# Patient Record
Sex: Male | Born: 1940 | Race: White | Hispanic: No | Marital: Married | Smoking: Former smoker
Health system: Southern US, Community
[De-identification: ages and names within clinical notes are randomized; demographics above are authoritative.]

## PROBLEM LIST (undated history)

## (undated) DIAGNOSIS — J189 Pneumonia, unspecified organism: Secondary | ICD-10-CM

## (undated) DIAGNOSIS — C801 Malignant (primary) neoplasm, unspecified: Secondary | ICD-10-CM

## (undated) DIAGNOSIS — E785 Hyperlipidemia, unspecified: Secondary | ICD-10-CM

## (undated) DIAGNOSIS — I10 Essential (primary) hypertension: Secondary | ICD-10-CM

## (undated) DIAGNOSIS — K219 Gastro-esophageal reflux disease without esophagitis: Secondary | ICD-10-CM

## (undated) HISTORY — PX: OTHER SURGICAL HISTORY: SHX169

## (undated) HISTORY — PX: TONSILLECTOMY: SUR1361

## (undated) HISTORY — PX: COLONOSCOPY: SHX174

## (undated) SURGERY — Surgical Case
Anesthesia: *Unknown

---

## 2005-11-19 ENCOUNTER — Ambulatory Visit: Payer: Self-pay | Admitting: Gastroenterology

## 2006-09-02 ENCOUNTER — Ambulatory Visit: Payer: Self-pay | Admitting: Unknown Physician Specialty

## 2006-09-08 ENCOUNTER — Ambulatory Visit: Payer: Self-pay | Admitting: Unknown Physician Specialty

## 2006-09-08 ENCOUNTER — Other Ambulatory Visit: Payer: Self-pay

## 2007-09-10 ENCOUNTER — Ambulatory Visit: Payer: Self-pay | Admitting: Internal Medicine

## 2008-10-10 ENCOUNTER — Ambulatory Visit: Payer: Self-pay | Admitting: Orthopedic Surgery

## 2010-10-29 ENCOUNTER — Ambulatory Visit: Payer: Self-pay | Admitting: Internal Medicine

## 2014-09-06 DIAGNOSIS — H5210 Myopia, unspecified eye: Secondary | ICD-10-CM | POA: Diagnosis not present

## 2014-09-06 DIAGNOSIS — H524 Presbyopia: Secondary | ICD-10-CM | POA: Diagnosis not present

## 2015-01-02 DIAGNOSIS — Z0000 Encounter for general adult medical examination without abnormal findings: Secondary | ICD-10-CM | POA: Diagnosis not present

## 2015-01-16 DIAGNOSIS — I10 Essential (primary) hypertension: Secondary | ICD-10-CM | POA: Diagnosis not present

## 2015-01-16 DIAGNOSIS — E782 Mixed hyperlipidemia: Secondary | ICD-10-CM | POA: Diagnosis not present

## 2015-01-16 DIAGNOSIS — Z0000 Encounter for general adult medical examination without abnormal findings: Secondary | ICD-10-CM | POA: Diagnosis not present

## 2015-07-04 DIAGNOSIS — J40 Bronchitis, not specified as acute or chronic: Secondary | ICD-10-CM | POA: Diagnosis not present

## 2015-08-16 DIAGNOSIS — L0211 Cutaneous abscess of neck: Secondary | ICD-10-CM | POA: Diagnosis not present

## 2015-08-24 DIAGNOSIS — H5210 Myopia, unspecified eye: Secondary | ICD-10-CM | POA: Diagnosis not present

## 2015-08-24 DIAGNOSIS — H524 Presbyopia: Secondary | ICD-10-CM | POA: Diagnosis not present

## 2015-10-09 DIAGNOSIS — Z0100 Encounter for examination of eyes and vision without abnormal findings: Secondary | ICD-10-CM | POA: Diagnosis not present

## 2015-11-14 DIAGNOSIS — R05 Cough: Secondary | ICD-10-CM | POA: Diagnosis not present

## 2015-11-14 DIAGNOSIS — B351 Tinea unguium: Secondary | ICD-10-CM | POA: Diagnosis not present

## 2015-11-14 DIAGNOSIS — K219 Gastro-esophageal reflux disease without esophagitis: Secondary | ICD-10-CM | POA: Diagnosis not present

## 2015-11-14 DIAGNOSIS — R0609 Other forms of dyspnea: Secondary | ICD-10-CM | POA: Diagnosis not present

## 2016-01-18 DIAGNOSIS — I10 Essential (primary) hypertension: Secondary | ICD-10-CM | POA: Diagnosis not present

## 2016-01-18 DIAGNOSIS — Z125 Encounter for screening for malignant neoplasm of prostate: Secondary | ICD-10-CM | POA: Diagnosis not present

## 2016-01-29 DIAGNOSIS — Z0000 Encounter for general adult medical examination without abnormal findings: Secondary | ICD-10-CM | POA: Diagnosis not present

## 2016-03-29 DIAGNOSIS — Z1211 Encounter for screening for malignant neoplasm of colon: Secondary | ICD-10-CM | POA: Diagnosis not present

## 2016-03-29 DIAGNOSIS — K219 Gastro-esophageal reflux disease without esophagitis: Secondary | ICD-10-CM | POA: Diagnosis not present

## 2016-04-16 ENCOUNTER — Other Ambulatory Visit: Payer: Self-pay | Admitting: Internal Medicine

## 2016-04-16 DIAGNOSIS — I6523 Occlusion and stenosis of bilateral carotid arteries: Secondary | ICD-10-CM

## 2016-04-24 ENCOUNTER — Ambulatory Visit
Admission: RE | Admit: 2016-04-24 | Discharge: 2016-04-24 | Disposition: A | Discharge status: Home / Self Care | Payer: Commercial Managed Care - HMO | Source: Ambulatory Visit | Attending: Internal Medicine | Admitting: Internal Medicine

## 2016-04-24 DIAGNOSIS — I6523 Occlusion and stenosis of bilateral carotid arteries: Secondary | ICD-10-CM | POA: Insufficient documentation

## 2016-05-29 ENCOUNTER — Other Ambulatory Visit: Payer: Self-pay | Admitting: Otolaryngology

## 2016-05-29 DIAGNOSIS — H93A2 Pulsatile tinnitus, left ear: Secondary | ICD-10-CM | POA: Diagnosis not present

## 2016-05-29 DIAGNOSIS — H6063 Unspecified chronic otitis externa, bilateral: Secondary | ICD-10-CM | POA: Diagnosis not present

## 2016-06-10 ENCOUNTER — Ambulatory Visit
Admission: RE | Admit: 2016-06-10 | Discharge: 2016-06-10 | Disposition: A | Discharge status: Home / Self Care | Payer: Commercial Managed Care - HMO | Source: Ambulatory Visit | Attending: Otolaryngology | Admitting: Otolaryngology

## 2016-06-10 ENCOUNTER — Ambulatory Visit: Payer: Commercial Managed Care - HMO

## 2016-06-10 DIAGNOSIS — H93A2 Pulsatile tinnitus, left ear: Secondary | ICD-10-CM

## 2016-06-12 DIAGNOSIS — H93A2 Pulsatile tinnitus, left ear: Secondary | ICD-10-CM | POA: Diagnosis not present

## 2016-07-16 DIAGNOSIS — I10 Essential (primary) hypertension: Secondary | ICD-10-CM | POA: Diagnosis not present

## 2016-07-26 ENCOUNTER — Encounter: Payer: Self-pay | Admitting: *Deleted

## 2016-07-29 ENCOUNTER — Encounter
Admission: RE | Disposition: A | Discharge status: Home / Self Care | Payer: Self-pay | Source: Ambulatory Visit | Attending: Gastroenterology

## 2016-07-29 ENCOUNTER — Ambulatory Visit: Payer: Commercial Managed Care - HMO | Admitting: Anesthesiology

## 2016-07-29 ENCOUNTER — Ambulatory Visit
Admission: RE | Admit: 2016-07-29 | Discharge: 2016-07-29 | Disposition: A | Discharge status: Home / Self Care | Payer: Commercial Managed Care - HMO | Source: Ambulatory Visit | Attending: Gastroenterology | Admitting: Gastroenterology

## 2016-07-29 DIAGNOSIS — Z1211 Encounter for screening for malignant neoplasm of colon: Secondary | ICD-10-CM | POA: Insufficient documentation

## 2016-07-29 DIAGNOSIS — K648 Other hemorrhoids: Secondary | ICD-10-CM | POA: Diagnosis not present

## 2016-07-29 DIAGNOSIS — Z79899 Other long term (current) drug therapy: Secondary | ICD-10-CM | POA: Diagnosis not present

## 2016-07-29 DIAGNOSIS — D127 Benign neoplasm of rectosigmoid junction: Secondary | ICD-10-CM | POA: Diagnosis not present

## 2016-07-29 DIAGNOSIS — K635 Polyp of colon: Secondary | ICD-10-CM | POA: Diagnosis not present

## 2016-07-29 DIAGNOSIS — K5730 Diverticulosis of large intestine without perforation or abscess without bleeding: Secondary | ICD-10-CM | POA: Insufficient documentation

## 2016-07-29 DIAGNOSIS — K641 Second degree hemorrhoids: Secondary | ICD-10-CM | POA: Insufficient documentation

## 2016-07-29 DIAGNOSIS — K5790 Diverticulosis of intestine, part unspecified, without perforation or abscess without bleeding: Secondary | ICD-10-CM | POA: Diagnosis not present

## 2016-07-29 DIAGNOSIS — Z7982 Long term (current) use of aspirin: Secondary | ICD-10-CM | POA: Insufficient documentation

## 2016-07-29 DIAGNOSIS — K219 Gastro-esophageal reflux disease without esophagitis: Secondary | ICD-10-CM | POA: Insufficient documentation

## 2016-07-29 DIAGNOSIS — I10 Essential (primary) hypertension: Secondary | ICD-10-CM | POA: Diagnosis not present

## 2016-07-29 HISTORY — DX: Gastro-esophageal reflux disease without esophagitis: K21.9

## 2016-07-29 HISTORY — DX: Essential (primary) hypertension: I10

## 2016-07-29 HISTORY — PX: COLONOSCOPY WITH PROPOFOL: SHX5780

## 2016-07-29 HISTORY — DX: Hyperlipidemia, unspecified: E78.5

## 2016-07-29 SURGERY — COLONOSCOPY WITH PROPOFOL
Anesthesia: General

## 2016-07-29 MED ORDER — SODIUM CHLORIDE 0.9 % IV SOLN: INTRAVENOUS | Status: DC

## 2016-07-29 MED ORDER — SODIUM CHLORIDE 0.9 % IV SOLN
INTRAVENOUS | Status: DC
  Administered 2016-07-29 (×2): via INTRAVENOUS

## 2016-07-29 MED ORDER — PROPOFOL 500 MG/50ML IV EMUL
INTRAVENOUS | Status: DC | PRN
  Administered 2016-07-29: 150 ug/kg/min via INTRAVENOUS

## 2016-07-29 MED ORDER — PROPOFOL 500 MG/50ML IV EMUL
INTRAVENOUS | Status: AC
  Filled 2016-07-29: qty 50

## 2016-07-29 MED ORDER — PROPOFOL 10 MG/ML IV BOLUS
INTRAVENOUS | Status: DC | PRN
  Administered 2016-07-29: 80 mg via INTRAVENOUS

## 2016-07-29 NOTE — Anesthesia Postprocedure Evaluation (Signed)
Anesthesia Post Note  Patient: Patrick Hood  Procedure(s) Performed: Procedure(s) (LRB): COLONOSCOPY WITH PROPOFOL (N/A)  Patient location during evaluation: Endoscopy Anesthesia Type: General Level of consciousness: awake and alert and oriented Pain management: pain level controlled Vital Signs Assessment: post-procedure vital signs reviewed and stable Respiratory status: spontaneous breathing, nonlabored ventilation and respiratory function stable Cardiovascular status: blood pressure returned to baseline and stable Postop Assessment: no signs of nausea or vomiting Anesthetic complications: no     Last Vitals:  Vitals:   07/29/16 1040 07/29/16 1050  BP: 111/79 117/83  Pulse: 66 66  Resp: 17 13  Temp:      Last Pain:  Vitals:   07/29/16 1030  TempSrc: Oral                 [name]

## 2016-07-29 NOTE — H&P (Signed)
Outpatient short stay form Pre-procedure 07/29/2016 9:55 AM Patrick Sails MD  Primary Physician: Dr. Emily Filbert  Reason for visit:  Colonoscopy  History of present illness:  Patient is a 76 year old male presenting today as above. He tolerated his prep well. He does take a mini dose/81 mg aspirin but is held that for several days. He takes no other aspirin or blood thinning agents. He does have some occasional problems with hemorrhoids. He is due for a routine 10 year screening procedure. There is no family history of colon polyps or colon cancer.    Current Facility-Administered Medications:  .  0.9 %  sodium chloride infusion, , Intravenous, Continuous, Patrick Sails, MD, Last Rate: 20 mL/hr at 07/29/16 0853 .  0.9 %  sodium chloride infusion, , Intravenous, Continuous, Patrick Sails, MD  Prescriptions Prior to Admission  Medication Sig Dispense Refill Last Dose  . aspirin 81 MG chewable tablet Chew by mouth daily.   07/27/2016 at Unknown time  . bisoprolol-hydrochlorothiazide (ZIAC) 5-6.25 MG tablet Take 1 tablet by mouth daily.   07/29/2016 at Unknown time  . doxazosin (CARDURA) 4 MG tablet Take 4 mg by mouth daily.   07/28/2016 at Unknown time  . pantoprazole (PROTONIX) 40 MG tablet Take 40 mg by mouth daily.   07/27/2016 at Unknown time  . simvastatin (ZOCOR) 40 MG tablet Take 40 mg by mouth daily.   07/27/2016 at Unknown time     Allergies  Allergen Reactions  . Ace Inhibitors Cough  . Amlodipine Cough     Past Medical History:  Diagnosis Date  . Elevated lipids   . GERD (gastroesophageal reflux disease)   . Hypertension     Review of systems:      Physical Exam    Heart and lungs: Regular rate and rhythm without rub or gallop, lungs are bilaterally clear.    HEENT: Normocephalic atraumatic eyes are anicteric    Other:     Pertinant exam for procedure: Soft nontender nondistended bowel sounds positive normoactive.    Planned proceedures: Colonoscopy and  indicated procedures. I have discussed the risks benefits and complications of procedures to include not limited to bleeding, infection, perforation and the risk of sedation and the patient wishes to proceed.    Patrick Sails, MD Gastroenterology 07/29/2016  9:55 AM

## 2016-07-29 NOTE — Anesthesia Preprocedure Evaluation (Signed)
Anesthesia Evaluation  Patient identified by MRN, date of birth, ID band Patient awake    Reviewed: Allergy & Precautions, NPO status , Patient's Chart, lab work & pertinent test results  History of Anesthesia Complications Negative for: history of anesthetic complications  Airway Mallampati: II  TM Distance: >3 FB Neck ROM: Full    Dental no notable dental hx.    Pulmonary neg pulmonary ROS, neg sleep apnea, neg COPD,    breath sounds clear to auscultation- rhonchi (-) wheezing      Cardiovascular hypertension, Pt. on medications (-) CAD and (-) Past MI  Rhythm:Regular Rate:Normal - Systolic murmurs and - Diastolic murmurs    Neuro/Psych negative neurological ROS  negative psych ROS   GI/Hepatic Neg liver ROS, GERD  ,  Endo/Other  negative endocrine ROSneg diabetes  Renal/GU negative Renal ROS     Musculoskeletal negative musculoskeletal ROS (+)   Abdominal (+) + obese,   Peds  Hematology negative hematology ROS (+)   Anesthesia Other Findings Past Medical History: No date: Elevated lipids No date: GERD (gastroesophageal reflux disease) No date: Hypertension   Reproductive/Obstetrics                             Anesthesia Physical Anesthesia Plan  ASA: II  Anesthesia Plan: General   Post-op Pain Management:    Induction: Intravenous  Airway Management Planned: Natural Airway  Additional Equipment:   Intra-op Plan:   Post-operative Plan:   Informed Consent: I have reviewed the patients History and Physical, chart, labs and discussed the procedure including the risks, benefits and alternatives for the proposed anesthesia with the patient or authorized representative who has indicated his/her understanding and acceptance.   Dental advisory given  Plan Discussed with: CRNA and Anesthesiologist  Anesthesia Plan Comments:         Anesthesia Quick Evaluation

## 2016-07-29 NOTE — Anesthesia Procedure Notes (Signed)
Date/Time: 07/29/2016 10:11 AM Performed by: Nelda Marseille Pre-anesthesia Checklist: Patient identified, Emergency Drugs available, Suction available, Patient being monitored and Timeout performed Oxygen Delivery Method: Nasal cannula

## 2016-07-29 NOTE — Transfer of Care (Signed)
Immediate Anesthesia Transfer of Care Note  Patient: Patrick Hood  Procedure(s) Performed: Procedure(s): COLONOSCOPY WITH PROPOFOL (N/A)  Patient Location: PACU  Anesthesia Type:General  Level of Consciousness: awake, alert  and sedated  Airway & Oxygen Therapy: Patient Spontanous Breathing and Patient connected to nasal cannula oxygen  Post-op Assessment: Report given to RN and Post -op Vital signs reviewed and stable  Post vital signs: Reviewed and stable  Last Vitals:  Vitals:   07/29/16 0840  BP: 139/83  Pulse: 78  Resp: 20  Temp: 36.4 C    Last Pain:  Vitals:   07/29/16 0840  TempSrc: Tympanic         Complications: No apparent anesthesia complications

## 2016-07-29 NOTE — Op Note (Signed)
[facility] Gastroenterology Patient Name: Patrick Hood Procedure Date: 07/29/2016 9:49 AM MRN: [id number] Account #: [id number] Date of Birth: [birth year] Admit Type: Outpatient Age: 76 Room: [facility] ENDO ROOM 3 Gender: Male Note Status: Finalized Procedure:            Colonoscopy Indications:          Screening for colorectal malignant neoplasm Providers:            Lollie Sails, MD Medicines:            Monitored Anesthesia Care Complications:        No immediate complications. Procedure:            Pre-Anesthesia Assessment:                       - ASA Grade Assessment: II - A patient with mild                        systemic disease.                       After obtaining informed consent, the colonoscope was                        passed under direct vision. Throughout the procedure,                        the patient's blood pressure, pulse, and oxygen                        saturations were monitored continuously. The                        Colonoscope was introduced through the anus and                        advanced to the the cecum, identified by appendiceal                        orifice and ileocecal valve. The colonoscopy was                        performed without difficulty. The patient tolerated the                        procedure well. The quality of the bowel preparation                        was good. Findings:      A less than 1 mm polyp was found in the recto-sigmoid colon. The polyp       was sessile. The polyp was removed with a cold biopsy forceps. Resection       and retrieval were complete.      Multiple small-mouthed diverticula were found in the sigmoid colon,       descending colon and distal descending colon.      Non-bleeding internal hemorrhoids were found during retroflexion, during       digital exam and during anoscopy. The hemorrhoids were small,       medium-sized and Grade II (internal hemorrhoids that prolapse but reduce        spontaneously).  The digital rectal exam was normal otherwise.      The exam was otherwise without abnormality. Impression:           - One less than 1 mm polyp at the recto-sigmoid colon,                        removed with a cold biopsy forceps. Resected and                        retrieved.                       - Diverticulosis in the sigmoid colon, in the                        descending colon and in the distal descending colon.                       - Non-bleeding internal hemorrhoids. Recommendation:       - Discharge patient to home.                       - Consider fles sig if continued symptoms. Procedure Code(s):    --- Professional ---                       [phone], Colonoscopy, flexible; with biopsy, single or                        multiple Diagnosis Code(s):    --- Professional ---                       Z12.11, Encounter for screening for malignant neoplasm                        of colon                       D12.7, Benign neoplasm of rectosigmoid junction                       K64.1, Second degree hemorrhoids                       K57.30, Diverticulosis of large intestine without                        perforation or abscess without bleeding CPT copyright 2016 American Medical [employer]. All rights reserved. The codes documented in this report are preliminary and upon coder review may  be revised to meet current compliance requirements. Lollie Sails, MD 07/29/2016 10:30:21 AM This report has been signed electronically. Number of Addenda: 0 Note Initiated On: 07/29/2016 9:49 AM Scope Withdrawal Time: 0 hours 9 minutes 57 seconds  Total Procedure Duration: 0 hours 17 minutes 28 seconds       [facility]

## 2016-07-30 ENCOUNTER — Encounter: Payer: Self-pay | Admitting: Gastroenterology

## 2016-07-30 LAB — SURGICAL PATHOLOGY

## 2016-08-22 DIAGNOSIS — M659 Synovitis and tenosynovitis, unspecified: Secondary | ICD-10-CM | POA: Diagnosis not present

## 2016-09-04 DIAGNOSIS — M659 Synovitis and tenosynovitis, unspecified: Secondary | ICD-10-CM | POA: Diagnosis not present

## 2016-09-04 DIAGNOSIS — M25611 Stiffness of right shoulder, not elsewhere classified: Secondary | ICD-10-CM | POA: Diagnosis not present

## 2016-09-04 DIAGNOSIS — M6281 Muscle weakness (generalized): Secondary | ICD-10-CM | POA: Diagnosis not present

## 2016-09-04 DIAGNOSIS — M25511 Pain in right shoulder: Secondary | ICD-10-CM | POA: Diagnosis not present

## 2016-09-09 DIAGNOSIS — H524 Presbyopia: Secondary | ICD-10-CM | POA: Diagnosis not present

## 2016-09-11 DIAGNOSIS — M25511 Pain in right shoulder: Secondary | ICD-10-CM | POA: Diagnosis not present

## 2016-09-11 DIAGNOSIS — M659 Synovitis and tenosynovitis, unspecified: Secondary | ICD-10-CM | POA: Diagnosis not present

## 2016-09-11 DIAGNOSIS — M6281 Muscle weakness (generalized): Secondary | ICD-10-CM | POA: Diagnosis not present

## 2016-09-11 DIAGNOSIS — M25611 Stiffness of right shoulder, not elsewhere classified: Secondary | ICD-10-CM | POA: Diagnosis not present

## 2016-09-13 DIAGNOSIS — M659 Synovitis and tenosynovitis, unspecified: Secondary | ICD-10-CM | POA: Diagnosis not present

## 2016-09-13 DIAGNOSIS — M25511 Pain in right shoulder: Secondary | ICD-10-CM | POA: Diagnosis not present

## 2016-09-13 DIAGNOSIS — M6281 Muscle weakness (generalized): Secondary | ICD-10-CM | POA: Diagnosis not present

## 2016-09-16 DIAGNOSIS — M25511 Pain in right shoulder: Secondary | ICD-10-CM | POA: Diagnosis not present

## 2016-09-16 DIAGNOSIS — M659 Synovitis and tenosynovitis, unspecified: Secondary | ICD-10-CM | POA: Diagnosis not present

## 2016-09-16 DIAGNOSIS — M6281 Muscle weakness (generalized): Secondary | ICD-10-CM | POA: Diagnosis not present

## 2016-09-18 DIAGNOSIS — M25511 Pain in right shoulder: Secondary | ICD-10-CM | POA: Diagnosis not present

## 2016-09-18 DIAGNOSIS — M659 Synovitis and tenosynovitis, unspecified: Secondary | ICD-10-CM | POA: Diagnosis not present

## 2016-09-18 DIAGNOSIS — M25611 Stiffness of right shoulder, not elsewhere classified: Secondary | ICD-10-CM | POA: Diagnosis not present

## 2016-09-18 DIAGNOSIS — M6281 Muscle weakness (generalized): Secondary | ICD-10-CM | POA: Diagnosis not present

## 2016-09-23 DIAGNOSIS — M6281 Muscle weakness (generalized): Secondary | ICD-10-CM | POA: Diagnosis not present

## 2016-09-23 DIAGNOSIS — M25511 Pain in right shoulder: Secondary | ICD-10-CM | POA: Diagnosis not present

## 2016-09-23 DIAGNOSIS — M659 Synovitis and tenosynovitis, unspecified: Secondary | ICD-10-CM | POA: Diagnosis not present

## 2016-09-23 DIAGNOSIS — M25611 Stiffness of right shoulder, not elsewhere classified: Secondary | ICD-10-CM | POA: Diagnosis not present

## 2016-09-25 DIAGNOSIS — M659 Synovitis and tenosynovitis, unspecified: Secondary | ICD-10-CM | POA: Diagnosis not present

## 2016-10-11 DIAGNOSIS — G8929 Other chronic pain: Secondary | ICD-10-CM | POA: Diagnosis not present

## 2016-10-11 DIAGNOSIS — M7521 Bicipital tendinitis, right shoulder: Secondary | ICD-10-CM | POA: Diagnosis not present

## 2016-10-11 DIAGNOSIS — M25511 Pain in right shoulder: Secondary | ICD-10-CM | POA: Diagnosis not present

## 2016-10-14 ENCOUNTER — Other Ambulatory Visit: Payer: Self-pay | Admitting: Student

## 2016-10-14 ENCOUNTER — Other Ambulatory Visit: Payer: Self-pay | Admitting: Surgery

## 2016-10-14 DIAGNOSIS — M25511 Pain in right shoulder: Secondary | ICD-10-CM

## 2016-10-14 DIAGNOSIS — G8929 Other chronic pain: Secondary | ICD-10-CM

## 2016-10-14 DIAGNOSIS — M7521 Bicipital tendinitis, right shoulder: Secondary | ICD-10-CM

## 2016-10-25 ENCOUNTER — Ambulatory Visit: Payer: Commercial Managed Care - HMO

## 2016-10-30 ENCOUNTER — Ambulatory Visit: Payer: Commercial Managed Care - HMO

## 2016-10-31 ENCOUNTER — Ambulatory Visit
Admission: RE | Admit: 2016-10-31 | Discharge: 2016-10-31 | Disposition: A | Discharge status: Home / Self Care | Payer: Medicare HMO | Source: Ambulatory Visit | Attending: Surgery | Admitting: Surgery

## 2016-10-31 ENCOUNTER — Ambulatory Visit
Admission: RE | Admit: 2016-10-31 | Discharge: 2016-10-31 | Disposition: A | Discharge status: Home / Self Care | Payer: Medicare HMO | Source: Ambulatory Visit | Attending: Student | Admitting: Student

## 2016-10-31 DIAGNOSIS — G8929 Other chronic pain: Secondary | ICD-10-CM | POA: Diagnosis not present

## 2016-10-31 DIAGNOSIS — M7551 Bursitis of right shoulder: Secondary | ICD-10-CM | POA: Insufficient documentation

## 2016-10-31 DIAGNOSIS — M25511 Pain in right shoulder: Secondary | ICD-10-CM | POA: Insufficient documentation

## 2016-10-31 DIAGNOSIS — M7521 Bicipital tendinitis, right shoulder: Secondary | ICD-10-CM

## 2016-10-31 DIAGNOSIS — M19011 Primary osteoarthritis, right shoulder: Secondary | ICD-10-CM | POA: Insufficient documentation

## 2016-10-31 DIAGNOSIS — M75101 Unspecified rotator cuff tear or rupture of right shoulder, not specified as traumatic: Secondary | ICD-10-CM | POA: Insufficient documentation

## 2016-10-31 MED ORDER — GADOBENATE DIMEGLUMINE 529 MG/ML IV SOLN
5.0000 mL | Freq: Once | INTRAVENOUS | Status: AC | PRN
  Administered 2016-10-31: 0.1 mL via INTRA_ARTICULAR

## 2016-10-31 MED ORDER — SODIUM CHLORIDE 0.9 % IJ SOLN
20.0000 mL | INTRAMUSCULAR | Status: DC | PRN
  Administered 2016-10-31: 10 mL

## 2016-10-31 MED ORDER — IOPAMIDOL (ISOVUE-200) INJECTION 41%
50.0000 mL | Freq: Once | INTRAVENOUS | Status: AC
  Administered 2016-10-31: 15 mL via INTRA_ARTICULAR
  Filled 2016-10-31: qty 50

## 2016-10-31 MED ORDER — LIDOCAINE HCL (PF) 1 % IJ SOLN
10.0000 mL | Freq: Once | INTRAMUSCULAR | Status: AC
  Administered 2016-10-31: 10 mL
  Filled 2016-10-31: qty 10

## 2016-11-04 DIAGNOSIS — M24111 Other articular cartilage disorders, right shoulder: Secondary | ICD-10-CM | POA: Diagnosis not present

## 2016-11-04 DIAGNOSIS — M7581 Other shoulder lesions, right shoulder: Secondary | ICD-10-CM | POA: Diagnosis not present

## 2016-11-04 DIAGNOSIS — M75111 Incomplete rotator cuff tear or rupture of right shoulder, not specified as traumatic: Secondary | ICD-10-CM | POA: Diagnosis not present

## 2016-11-13 ENCOUNTER — Ambulatory Visit: Payer: Commercial Managed Care - HMO

## 2016-11-28 ENCOUNTER — Encounter
Admission: RE | Admit: 2016-11-28 | Discharge: 2016-11-28 | Disposition: A | Discharge status: Home / Self Care | Payer: Medicare HMO | Source: Ambulatory Visit | Attending: Surgery | Admitting: Surgery

## 2016-11-28 DIAGNOSIS — Z01812 Encounter for preprocedural laboratory examination: Secondary | ICD-10-CM | POA: Diagnosis not present

## 2016-11-28 DIAGNOSIS — Z01810 Encounter for preprocedural cardiovascular examination: Secondary | ICD-10-CM | POA: Diagnosis not present

## 2016-11-28 DIAGNOSIS — I10 Essential (primary) hypertension: Secondary | ICD-10-CM | POA: Diagnosis not present

## 2016-11-28 LAB — CBC
HEMATOCRIT: 44.7 % (ref 40.0–52.0)
Hemoglobin: 15.1 g/dL (ref 13.0–18.0)
MCH: 31 pg (ref 26.0–34.0)
MCHC: 33.9 g/dL (ref 32.0–36.0)
MCV: 91.4 fL (ref 80.0–100.0)
Platelets: 266 10*3/uL (ref 150–440)
RBC: 4.89 MIL/uL (ref 4.40–5.90)
RDW: 13.3 % (ref 11.5–14.5)
WBC: 7 10*3/uL (ref 3.8–10.6)

## 2016-11-28 NOTE — Patient Instructions (Signed)
Your procedure is scheduled on: 12/17/16 Tues Report to Same Day Surgery 2nd floor medical mall [facility] Entrance-take elevator on left to 2nd floor.  Check in with surgery information desk.) To find out your arrival time please call [phone] between 1PM - 3PM on 12/16/16 Mon  Remember: Instructions that are not followed completely may result in serious medical risk, up to and including death, or upon the discretion of your surgeon and anesthesiologist your surgery may need to be rescheduled.    _x___ 1. Do not eat food or drink liquids after midnight. No gum chewing or                              hard candies.     __x__ 2. No Alcohol for 24 hours before or after surgery.   __x__3. No Smoking for 24 prior to surgery.   ____  4. Bring all medications with you on the day of surgery if instructed.    __x__ 5. Notify your doctor if there is any change in your medical condition     (cold, fever, infections).     Do not wear jewelry, make-up, hairpins, clips or nail polish.  Do not wear lotions, powders, or perfumes. You may wear deodorant.  Do not shave 48 hours prior to surgery. Men may shave face and neck.  Do not bring valuables to the hospital.    [facility] is not responsible for any belongings or valuables.               Contacts, dentures or bridgework may not be worn into surgery.  Leave your suitcase in the car. After surgery it may be brought to your room.  For patients admitted to the hospital, discharge time is determined by your                       treatment team.   Patients discharged the day of surgery will not be allowed to drive home.  You will need someone to drive you home and stay with you the night of your procedure.    Please read over the following fact sheets that you were given:   [facility] Preparing for Surgery and or MRSA Information   _x___ Take anti-hypertensive (unless it includes a diuretic), cardiac, seizure, asthma,     anti-reflux and  psychiatric medicines. These include:  1. pantoprazole (PROTONIX  2.  3.  4.  5.  6.  ____Fleets enema or Magnesium Citrate as directed.   _x___ Use CHG Soap or sage wipes as directed on instruction sheet   ____ Use inhalers on the day of surgery and bring to hospital day of surgery  ____ Stop Metformin and Janumet 2 days prior to surgery.    ____ Take 1/2 of usual insulin dose the night before surgery and none on the morning     surgery.   _x___ Follow recommendations from Cardiologist, Pulmonologist or PCP regarding          stopping Aspirin, Coumadin, Pllavix ,Eliquis, Effient, or Pradaxa, and Pletal.  X____Stop Anti-inflammatories such as Advil, Aleve, Ibuprofen, Motrin, Naproxen, Naprosyn, Goodies powders or aspirin products. OK to take Tylenol and                          Celebrex.   _x___ Stop supplements until after surgery.  But may continue Vitamin D, Vitamin  B,       and multivitamin.   ____ Bring C-Pap to the hospital.

## 2016-12-10 ENCOUNTER — Other Ambulatory Visit: Payer: Commercial Managed Care - HMO

## 2016-12-16 MED ORDER — CEFAZOLIN SODIUM-DEXTROSE 2-4 GM/100ML-% IV SOLN
2.0000 g | Freq: Once | INTRAVENOUS | Status: AC
  Administered 2016-12-17: 2 g via INTRAVENOUS

## 2016-12-17 ENCOUNTER — Encounter: Payer: Self-pay | Admitting: *Deleted

## 2016-12-17 ENCOUNTER — Ambulatory Visit
Admission: RE | Admit: 2016-12-17 | Discharge: 2016-12-17 | Disposition: A | Discharge status: Home / Self Care | Payer: Medicare HMO | Source: Ambulatory Visit | Attending: Surgery | Admitting: Surgery

## 2016-12-17 ENCOUNTER — Ambulatory Visit: Payer: Medicare HMO | Admitting: Anesthesiology

## 2016-12-17 ENCOUNTER — Encounter
Admission: RE | Disposition: A | Discharge status: Home / Self Care | Payer: Self-pay | Source: Ambulatory Visit | Attending: Surgery

## 2016-12-17 DIAGNOSIS — Z79899 Other long term (current) drug therapy: Secondary | ICD-10-CM | POA: Diagnosis not present

## 2016-12-17 DIAGNOSIS — Z888 Allergy status to other drugs, medicaments and biological substances status: Secondary | ICD-10-CM | POA: Diagnosis not present

## 2016-12-17 DIAGNOSIS — S43401A Unspecified sprain of right shoulder joint, initial encounter: Secondary | ICD-10-CM | POA: Diagnosis not present

## 2016-12-17 DIAGNOSIS — Z7982 Long term (current) use of aspirin: Secondary | ICD-10-CM | POA: Diagnosis not present

## 2016-12-17 DIAGNOSIS — G8918 Other acute postprocedural pain: Secondary | ICD-10-CM | POA: Diagnosis not present

## 2016-12-17 DIAGNOSIS — M7521 Bicipital tendinitis, right shoulder: Secondary | ICD-10-CM | POA: Diagnosis not present

## 2016-12-17 DIAGNOSIS — J449 Chronic obstructive pulmonary disease, unspecified: Secondary | ICD-10-CM | POA: Insufficient documentation

## 2016-12-17 DIAGNOSIS — M24111 Other articular cartilage disorders, right shoulder: Secondary | ICD-10-CM | POA: Diagnosis not present

## 2016-12-17 DIAGNOSIS — Z87891 Personal history of nicotine dependence: Secondary | ICD-10-CM | POA: Insufficient documentation

## 2016-12-17 DIAGNOSIS — M7531 Calcific tendinitis of right shoulder: Secondary | ICD-10-CM | POA: Insufficient documentation

## 2016-12-17 DIAGNOSIS — M7581 Other shoulder lesions, right shoulder: Secondary | ICD-10-CM | POA: Diagnosis not present

## 2016-12-17 DIAGNOSIS — M7541 Impingement syndrome of right shoulder: Secondary | ICD-10-CM | POA: Diagnosis not present

## 2016-12-17 DIAGNOSIS — M65811 Other synovitis and tenosynovitis, right shoulder: Secondary | ICD-10-CM | POA: Insufficient documentation

## 2016-12-17 DIAGNOSIS — E785 Hyperlipidemia, unspecified: Secondary | ICD-10-CM | POA: Insufficient documentation

## 2016-12-17 DIAGNOSIS — M7591 Shoulder lesion, unspecified, right shoulder: Secondary | ICD-10-CM | POA: Diagnosis not present

## 2016-12-17 DIAGNOSIS — K219 Gastro-esophageal reflux disease without esophagitis: Secondary | ICD-10-CM | POA: Insufficient documentation

## 2016-12-17 DIAGNOSIS — M75111 Incomplete rotator cuff tear or rupture of right shoulder, not specified as traumatic: Secondary | ICD-10-CM | POA: Insufficient documentation

## 2016-12-17 DIAGNOSIS — I10 Essential (primary) hypertension: Secondary | ICD-10-CM | POA: Diagnosis not present

## 2016-12-17 DIAGNOSIS — M25511 Pain in right shoulder: Secondary | ICD-10-CM | POA: Diagnosis not present

## 2016-12-17 DIAGNOSIS — Z5333 Arthroscopic surgical procedure converted to open procedure: Secondary | ICD-10-CM | POA: Diagnosis not present

## 2016-12-17 HISTORY — PX: BICEPT TENODESIS: SHX5116

## 2016-12-17 HISTORY — PX: SHOULDER ARTHROSCOPY WITH OPEN ROTATOR CUFF REPAIR: SHX6092

## 2016-12-17 HISTORY — PX: SUBACROMIAL DECOMPRESSION: SHX5174

## 2016-12-17 SURGERY — ARTHROSCOPY, SHOULDER WITH REPAIR, ROTATOR CUFF, OPEN
Anesthesia: General | Site: Shoulder | Laterality: Right | Wound class: Clean

## 2016-12-17 MED ORDER — ONDANSETRON HCL 4 MG/2ML IJ SOLN
INTRAMUSCULAR | Status: DC | PRN
  Administered 2016-12-17: 4 mg via INTRAVENOUS

## 2016-12-17 MED ORDER — LIDOCAINE HCL (PF) 2 % IJ SOLN
INTRAMUSCULAR | Status: AC
  Filled 2016-12-17: qty 2

## 2016-12-17 MED ORDER — FENTANYL CITRATE (PF) 100 MCG/2ML IJ SOLN: 25.0000 ug | INTRAMUSCULAR | Status: DC | PRN

## 2016-12-17 MED ORDER — FENTANYL CITRATE (PF) 100 MCG/2ML IJ SOLN
INTRAMUSCULAR | Status: AC
  Administered 2016-12-17: 50 ug via INTRAVENOUS
  Filled 2016-12-17: qty 2

## 2016-12-17 MED ORDER — EPINEPHRINE 30 MG/30ML IJ SOLN
INTRAMUSCULAR | Status: AC
  Filled 2016-12-17: qty 1

## 2016-12-17 MED ORDER — MIDAZOLAM HCL 2 MG/2ML IJ SOLN
INTRAMUSCULAR | Status: AC
  Administered 2016-12-17: 1 mg via INTRAVENOUS
  Filled 2016-12-17: qty 2

## 2016-12-17 MED ORDER — ROPIVACAINE HCL 2 MG/ML IJ SOLN
INTRAMUSCULAR | Status: AC
  Filled 2016-12-17: qty 40

## 2016-12-17 MED ORDER — FENTANYL CITRATE (PF) 100 MCG/2ML IJ SOLN
50.0000 ug | Freq: Once | INTRAMUSCULAR | Status: AC
  Administered 2016-12-17: 50 ug via INTRAVENOUS

## 2016-12-17 MED ORDER — LIDOCAINE HCL (CARDIAC) 20 MG/ML IV SOLN
INTRAVENOUS | Status: DC | PRN
  Administered 2016-12-17: 100 mg via INTRAVENOUS

## 2016-12-17 MED ORDER — MIDAZOLAM HCL 2 MG/2ML IJ SOLN
1.0000 mg | Freq: Once | INTRAMUSCULAR | Status: AC
  Administered 2016-12-17: 1 mg via INTRAVENOUS

## 2016-12-17 MED ORDER — BUPIVACAINE-EPINEPHRINE (PF) 0.5% -1:200000 IJ SOLN
INTRAMUSCULAR | Status: AC
  Filled 2016-12-17: qty 30

## 2016-12-17 MED ORDER — FENTANYL CITRATE (PF) 100 MCG/2ML IJ SOLN
INTRAMUSCULAR | Status: DC | PRN
  Administered 2016-12-17: 25 ug via INTRAVENOUS
  Administered 2016-12-17: 75 ug via INTRAVENOUS
  Administered 2016-12-17: 100 ug via INTRAVENOUS

## 2016-12-17 MED ORDER — ROCURONIUM BROMIDE 50 MG/5ML IV SOLN
INTRAVENOUS | Status: AC
  Filled 2016-12-17: qty 1

## 2016-12-17 MED ORDER — FENTANYL CITRATE (PF) 100 MCG/2ML IJ SOLN
INTRAMUSCULAR | Status: AC
  Filled 2016-12-17: qty 2

## 2016-12-17 MED ORDER — OXYCODONE HCL 5 MG/5ML PO SOLN: 5.0000 mg | Freq: Once | ORAL | Status: AC | PRN

## 2016-12-17 MED ORDER — PROPOFOL 10 MG/ML IV BOLUS
INTRAVENOUS | Status: DC | PRN
  Administered 2016-12-17: 140 mg via INTRAVENOUS

## 2016-12-17 MED ORDER — OXYCODONE HCL 5 MG PO TABS
ORAL_TABLET | ORAL | Status: AC
  Filled 2016-12-17: qty 1

## 2016-12-17 MED ORDER — LIDOCAINE HCL (PF) 1 % IJ SOLN
INTRAMUSCULAR | Status: DC | PRN
  Administered 2016-12-17: 1 mL via INTRADERMAL

## 2016-12-17 MED ORDER — LACTATED RINGERS IV SOLN
INTRAVENOUS | Status: DC
  Administered 2016-12-17 (×2): via INTRAVENOUS

## 2016-12-17 MED ORDER — SUGAMMADEX SODIUM 500 MG/5ML IV SOLN
INTRAVENOUS | Status: AC
  Filled 2016-12-17: qty 5

## 2016-12-17 MED ORDER — LIDOCAINE HCL (PF) 1 % IJ SOLN
INTRAMUSCULAR | Status: AC
  Filled 2016-12-17: qty 5

## 2016-12-17 MED ORDER — PHENYLEPHRINE HCL 10 MG/ML IJ SOLN
INTRAVENOUS | Status: DC | PRN
  Administered 2016-12-17: 30 ug/min via INTRAVENOUS

## 2016-12-17 MED ORDER — SUGAMMADEX SODIUM 200 MG/2ML IV SOLN
INTRAVENOUS | Status: DC | PRN
  Administered 2016-12-17: 140 mg via INTRAVENOUS

## 2016-12-17 MED ORDER — CEFAZOLIN SODIUM-DEXTROSE 2-4 GM/100ML-% IV SOLN
INTRAVENOUS | Status: AC
  Filled 2016-12-17: qty 100

## 2016-12-17 MED ORDER — OXYCODONE HCL 5 MG PO TABS: 5.0000 mg | ORAL_TABLET | ORAL | 0 refills | Status: DC | PRN

## 2016-12-17 MED ORDER — ROPIVACAINE HCL 5 MG/ML IJ SOLN
INTRAMUSCULAR | Status: DC | PRN
  Administered 2016-12-17: 20 mL via PERINEURAL
  Administered 2016-12-17: 10 mL via PERINEURAL

## 2016-12-17 MED ORDER — BUPIVACAINE-EPINEPHRINE (PF) 0.5% -1:200000 IJ SOLN
INTRAMUSCULAR | Status: DC | PRN
  Administered 2016-12-17: 30 mL via PERINEURAL

## 2016-12-17 MED ORDER — LACTATED RINGERS IV SOLN
INTRAVENOUS | Status: DC | PRN
  Administered 2016-12-17: 2 mL

## 2016-12-17 MED ORDER — LIDOCAINE HCL (PF) 4 % IJ SOLN
INTRAMUSCULAR | Status: DC | PRN
  Administered 2016-12-17: 4 mL via RESPIRATORY_TRACT

## 2016-12-17 MED ORDER — OXYCODONE HCL 5 MG PO TABS
5.0000 mg | ORAL_TABLET | Freq: Once | ORAL | Status: AC | PRN
  Administered 2016-12-17: 5 mg via ORAL

## 2016-12-17 MED ORDER — ROCURONIUM BROMIDE 100 MG/10ML IV SOLN
INTRAVENOUS | Status: DC | PRN
  Administered 2016-12-17: 50 mg via INTRAVENOUS
  Administered 2016-12-17: 20 mg via INTRAVENOUS

## 2016-12-17 MED ORDER — EPINEPHRINE PF 1 MG/ML IJ SOLN
INTRAMUSCULAR | Status: AC
  Filled 2016-12-17: qty 2

## 2016-12-17 MED ORDER — ONDANSETRON HCL 4 MG/2ML IJ SOLN
INTRAMUSCULAR | Status: AC
  Filled 2016-12-17: qty 2

## 2016-12-17 SURGICAL SUPPLY — 47 items
ANCHOR JUGGERKNOT WTAP NDL 2.9 (Anchor) ×8 IMPLANT
BIT DRILL JUGRKNT W/NDL BIT2.9 (DRILL) ×2 IMPLANT
BLADE FULL RADIUS 3.5 (BLADE) ×4 IMPLANT
BUR ACROMIONIZER 4.0 (BURR) ×4 IMPLANT
CANNULA SHAVER 8MMX76MM (CANNULA) ×4 IMPLANT
CHLORAPREP W/TINT 26ML (MISCELLANEOUS) ×4 IMPLANT
COVER MAYO STAND STRL (DRAPES) ×4 IMPLANT
DRAPE IMP U-DRAPE 54X76 (DRAPES) ×8 IMPLANT
DRILL JUGGERKNOT W/NDL BIT 2.9 (DRILL) ×4
DRSG OPSITE POSTOP 4X8 (GAUZE/BANDAGES/DRESSINGS) ×4 IMPLANT
ELECT REM PT RETURN 9FT ADLT (ELECTROSURGICAL) ×4
ELECTRODE REM PT RTRN 9FT ADLT (ELECTROSURGICAL) ×2 IMPLANT
GAUZE PETRO XEROFOAM 1X8 (MISCELLANEOUS) ×4 IMPLANT
GAUZE SPONGE 4X4 12PLY STRL (GAUZE/BANDAGES/DRESSINGS) ×4 IMPLANT
GLOVE BIO SURGEON STRL SZ7.5 (GLOVE) ×12 IMPLANT
GLOVE BIO SURGEON STRL SZ8 (GLOVE) ×8 IMPLANT
GLOVE BIOGEL PI IND STRL 8 (GLOVE) ×6 IMPLANT
GLOVE BIOGEL PI INDICATOR 8 (GLOVE) ×6
GLOVE INDICATOR 8.0 STRL GRN (GLOVE) ×4 IMPLANT
GOWN STRL REUS W/ TWL LRG LVL3 (GOWN DISPOSABLE) ×6 IMPLANT
GOWN STRL REUS W/ TWL XL LVL3 (GOWN DISPOSABLE) ×2 IMPLANT
GOWN STRL REUS W/TWL LRG LVL3 (GOWN DISPOSABLE) ×6
GOWN STRL REUS W/TWL XL LVL3 (GOWN DISPOSABLE) ×2
GRASPER SUT 15 45D LOW PRO (SUTURE) IMPLANT
IV LACTATED RINGER IRRG 3000ML (IV SOLUTION) ×4
IV LR IRRIG 3000ML ARTHROMATIC (IV SOLUTION) ×4 IMPLANT
MANIFOLD NEPTUNE II (INSTRUMENTS) ×4 IMPLANT
MASK FACE SPIDER DISP (MASK) ×4 IMPLANT
MAT BLUE FLOOR 46X72 FLO (MISCELLANEOUS) ×4 IMPLANT
NDL MAYO CATGUT SZ5 (NEEDLE)
NDL SUT 5 .5 CRC TPR PNT MAYO (NEEDLE) IMPLANT
NEEDLE REVERSE CUT 1/2 CRC (NEEDLE) IMPLANT
PACK ARTHROSCOPY SHOULDER (MISCELLANEOUS) ×4 IMPLANT
SLEEVE PROTECTION STRL DISP (MISCELLANEOUS) ×4 IMPLANT
SLING ARM LRG DEEP (SOFTGOODS) ×4 IMPLANT
SLING ULTRA II LG (MISCELLANEOUS) ×4 IMPLANT
STAPLER SKIN PROX 35W (STAPLE) ×4 IMPLANT
STRAP SAFETY BODY (MISCELLANEOUS) ×4 IMPLANT
SUT ETHIBOND 0 MO6 C/R (SUTURE) ×4 IMPLANT
SUT VIC AB 2-0 CT1 27 (SUTURE) ×4
SUT VIC AB 2-0 CT1 TAPERPNT 27 (SUTURE) ×4 IMPLANT
SYR 10ML LL (SYRINGE) ×4 IMPLANT
TAPE MICROFOAM 4IN (TAPE) ×4 IMPLANT
TUBING ARTHRO INFLOW-ONLY STRL (TUBING) ×4 IMPLANT
TUBING CONNECTING 10 (TUBING) ×3 IMPLANT
TUBING CONNECTING 10' (TUBING) ×1
WAND HAND CNTRL MULTIVAC 90 (MISCELLANEOUS) ×4 IMPLANT

## 2016-12-17 NOTE — Anesthesia Post-op Follow-up Note (Cosign Needed)
Anesthesia QCDR form completed.        

## 2016-12-17 NOTE — Discharge Instructions (Signed)
Keep dressing dry and intact.  °May shower after dressing changed on post-op day #4 (Saturday).  °Cover staples with Band-Aids after drying off. °Apply ice frequently to shoulder. °Take ibuprofen 600-800 mg TID with meals for 7-10 days, then as necessary. °Take oxycodone as prescribed when needed.  °May supplement with ES Tylenol if necessary. °Keep shoulder immobilizer on at all times except may remove for bathing purposes. °Follow-up in 10-14 days or as scheduled. °

## 2016-12-17 NOTE — Anesthesia Procedure Notes (Signed)
Procedure Name: Intubation Date/Time: 12/17/2016 10:59 AM Performed by: Rosaria Ferries, Kaide Gage Pre-anesthesia Checklist: Patient identified, Emergency Drugs available, Suction available and Patient being monitored Patient Re-evaluated:Patient Re-evaluated prior to inductionOxygen Delivery Method: Circle system utilized Preoxygenation: Pre-oxygenation with 100% oxygen Intubation Type: IV induction Laryngoscope Size: Mac and 3 Grade View: Grade I Tube size: 7.0 mm Number of attempts: 1 Airway Equipment and Method: LTA kit utilized Placement Confirmation: ETT inserted through vocal cords under direct vision,  positive ETCO2 and breath sounds checked- equal and bilateral Secured at: 22 cm Tube secured with: Tape Dental Injury: Teeth and Oropharynx as per pre-operative assessment

## 2016-12-17 NOTE — Progress Notes (Signed)
Patient taken to Pacu for nerve block.  Report given To Candace RN.

## 2016-12-17 NOTE — Anesthesia Procedure Notes (Signed)
Anesthesia Regional Block: Interscalene brachial plexus block   Pre-Anesthetic Checklist: ,, timeout performed, Correct Patient, Correct Site, Correct Laterality, Correct Procedure, Correct Position, site marked, Risks and benefits discussed,  Surgical consent,  Pre-op evaluation,  At surgeon's request and post-op pain management  Laterality: Upper and Right  Prep: chloraprep       Needles:  Injection technique: Single-shot  Needle Type: Stimiplex     Needle Length: 5cm  Needle Gauge: 22     Additional Needles:   Procedures: ultrasound guided,,,,,,,,  Narrative:  Start time: 12/17/2016 10:05 AM End time: 12/17/2016 10:07 AM Injection made incrementally with aspirations every 5 mL.  Performed by: Personally  Anesthesiologist: Katy Fitch K  Additional Notes: Patient endorses baseline weakness and pain in right shoulder and arm  Functioning IV was confirmed and monitors were applied.  A 42mm 22ga Stimuplex needle was used. Sterile prep,hand hygiene and sterile gloves were used.  Minimal sedation used for procedure.  No paresthesia endorsed by patient during the procedure.  Negative aspiration and negative test dose prior to incremental administration of local anesthetic. The patient tolerated the procedure well with no immediate complications.

## 2016-12-17 NOTE — Anesthesia Preprocedure Evaluation (Signed)
Anesthesia Evaluation  Patient identified by MRN, date of birth, ID band Patient awake    Reviewed: Allergy & Precautions, H&P , NPO status , Patient's Chart, lab work & pertinent test results  History of Anesthesia Complications Negative for: history of anesthetic complications  Airway Mallampati: III  TM Distance: >3 FB Neck ROM: limited    Dental  (+) Poor Dentition, Chipped, Caps, Missing, Partial Lower, Partial Upper   Pulmonary neg shortness of breath, COPD (95% on RA today with history of smoking), former smoker,    Pulmonary exam normal breath sounds clear to auscultation       Cardiovascular Exercise Tolerance: Good hypertension, (-) angina(-) Past MI and (-) DOE Normal cardiovascular exam Rhythm:regular Rate:Normal     Neuro/Psych negative neurological ROS  negative psych ROS   GI/Hepatic Neg liver ROS, GERD  Controlled,  Endo/Other  negative endocrine ROS  Renal/GU      Musculoskeletal   Abdominal   Peds  Hematology negative hematology ROS (+)   Anesthesia Other Findings Past Medical History: No date: Elevated lipids No date: GERD (gastroesophageal reflux disease) No date: Hypertension  Past Surgical History: No date: COLONOSCOPY 07/29/2016: COLONOSCOPY WITH PROPOFOL N/A     Comment: Procedure: COLONOSCOPY WITH PROPOFOL;                Surgeon: Lollie Sails, MD;  Location: [facility]              ENDOSCOPY;  Service: Endoscopy;  Laterality:               N/A; No date: prostrate     Comment: patient denies only had a biopsy No date: TONSILLECTOMY     Reproductive/Obstetrics negative OB ROS                             Anesthesia Physical Anesthesia Plan  ASA: III  Anesthesia Plan: General ETT   Post-op Pain Management:  Regional for Post-op pain   Induction: Intravenous  Airway Management Planned: Oral ETT  Additional Equipment:   Intra-op Plan:    Post-operative Plan: Extubation in OR  Informed Consent: I have reviewed the patients History and Physical, chart, labs and discussed the procedure including the risks, benefits and alternatives for the proposed anesthesia with the patient or authorized representative who has indicated his/her understanding and acceptance.   Dental Advisory Given  Plan Discussed with: Anesthesiologist, CRNA and Surgeon  Anesthesia Plan Comments: (95% on RA today, history of smoking.  METS are greater than 4.  Consented for risk and benefits of block including but not limited to nerve damage and diphragmatic paralysis requiring overnight observation.  Patient and wife consented and voiced understanding.  Patient endorses weakness and pain in right shoulder and arm/  Patient consented for risks of anesthesia including but not limited to:  - adverse reactions to medications - damage to teeth, lips or other oral mucosa - sore throat or hoarseness - Damage to heart, brain, lungs or loss of life  Patient voiced understanding.)        Anesthesia Quick Evaluation

## 2016-12-17 NOTE — Transfer of Care (Signed)
Immediate Anesthesia Transfer of Care Note  Patient: Patrick Hood  Procedure(s) Performed: Procedure(s): SHOULDER ARTHROSCOPY WITH OPEN ROTATOR CUFF REPAIR (Right) BICEPS TENODESIS SUBACROMIAL DECOMPRESSION  Patient Location: PACU  Anesthesia Type:General  Level of Consciousness: awake, alert , oriented and patient cooperative  Airway & Oxygen Therapy: Patient Spontanous Breathing and Patient connected to nasal cannula oxygen  Post-op Assessment: Report given to RN and Post -op Vital signs reviewed and stable  Post vital signs: Reviewed and stable  Last Vitals:  Vitals:   12/17/16 1014 12/17/16 1030  BP: (!) 141/90 (!) 146/90  Pulse: 61 72  Resp: 17 14  Temp:      Last Pain:  Vitals:   12/17/16 0901  TempSrc: Oral  PainSc: 7          Complications: No apparent anesthesia complications

## 2016-12-17 NOTE — H&P (Signed)
Paper H&P to be scanned into permanent record. H&P reviewed and patient re-examined. No changes. 

## 2016-12-17 NOTE — Op Note (Signed)
12/17/2016  12:49 PM  Patient:   Patrick Hood  Pre-Op Diagnosis:   Impingement/tendinopathy with near full-thickness rotator cuff tear and degenerative labral fraying, right shoulder.  Post-Op Diagnosis: Impingement/tendinopathy with near full-thickness rotator cuff tear, labral fraying and biceps tendinopathy, right shoulder.  Procedure: Limited arthroscopic debridement, arthroscopic subacromial decompression, mini-open rotator cuff repair, and mini-open biceps tenodesis, right shoulder.  Anesthesia: General endotracheal with interscalene block placed preoperatively by the anesthesiologist.  Surgeon:   Pascal Lux, MD  Assistant:   None  Findings: As above. There was mild to moderate fraying of the labrum anteriorly and superiorly without detachment from the glenoid. There was mild-moderate tendinopathy of the biceps tendon without tearing. There was mild articular surface tearing of the superior insertional fibers of the subscapularis tendon involving 10-15% of the superior footprint. There was a near full-thickness bursal surface tear of the mid insertional fibers of the supraspinatus tendon. The remainder of the rotator cuff was in excellent condition.  Complications: None  Fluids:   1200 cc  Estimated blood loss: 20 cc  Tourniquet time: None  Drains: None  Closure: Staples   Brief clinical note: The patient is a 76 year old male with a history of right shoulder pain. The patient's symptoms have progressed despite medications, activity modification, etc. The patient's history and examination are consistent with impingement/tendinopathy with a rotator cuff tear. These findings were confirmed by MRI scan. The patient presents at this time for definitive management of these shoulder symptoms.  Procedure: The patient underwent placement of an interscalene block by the anesthesiologist in the preoperative holding area before being brought into the  operating room and lain in the supine position. The patient then underwent general endotracheal intubation and anesthesia before being repositioned in the beach chair position using the beach chair positioner. The right shoulder and upper extremity were prepped with ChloraPrep solution before being draped sterilely. Preoperative antibiotics were administered. A timeout was performed to confirm the proper surgical site before the expected portal sites and incision site were injected with 0.5% Sensorcaine with epinephrine. A posterior portal was created and the glenohumeral joint thoroughly inspected with the findings as described above. An anterior portal was created using an outside-in technique. The labrum and rotator cuff were further probed, again confirming the above-noted findings. The areas of labral fraying and synovitis were debrided back to stable margins using the full-radius resector. The ArthroCare wand was inserted and used to release the biceps tendon from its labral anchor. It also was used to obtain hemostasis as well as to "anneal" the labrum superiorly and anteriorly. The instruments were removed from the joint after suctioning the excess fluid.  The camera was repositioned through the posterior portal into the subacromial space. A separate lateral portal was created using an outside-in technique. The 3.5 mm full-radius resector was introduced and used to perform a subtotal bursectomy. The ArthroCare wand was then inserted and used to remove the periosteal tissue off the undersurface of the anterior third of the acromion as well as to recess the coracoacromial ligament from its attachment along the anterior and lateral margins of the acromion. There was no significant bony prominence involving the anterior or lateral margins of the labrum, so no bony acromioplasty was performed. The instruments were then removed from the subacromial space after suctioning the excess fluid.  An approximately 4-5  cm incision was made over the anterolateral aspect of the shoulder beginning at the anterolateral corner of the acromion and extending distally in line with the  bicipital groove. This incision was carried down through the subcutaneous tissues to expose the deltoid fascia. The raphae between the anterior and middle thirds was identified and this plane developed to provide access into the subacromial space. Additional bursal tissues were debrided sharply using Metzenbaum scissors. The rotator cuff tear was readily identified. The margins were debrided sharply with a #15 blade and the exposed greater tuberosity roughened with a rongeur. The tear was repaired using a single Biomet 2.9 mm JuggerKnot anchor. These sutures were then brought back laterally through the remaining cuff tissue and retied to create a two-layer closure equivalent. Several additional #0 Ethibond interrupted sutures were placed in a side-to-side fashion to reinforce the repair. An apparent watertight closure was obtained.  The bicipital groove was identified by palpation and opened for 1-1.5 cm. The biceps tendon stump was retrieved through this defect. The floor of the bicipital groove was roughened with a curet before another Biomet 2.9 mm JuggerKnot anchor was inserted. Both sets of sutures were passed through the biceps tendon and tied securely to effect the tenodesis. The bicipital sheath was reapproximated using two #0 Ethibond interrupted sutures, incorporating the biceps tendon to further reinforce the tenodesis.  The wound was copiously irrigated with sterile saline solution before the deltoid raphae was reapproximated using 2-0 Vicryl interrupted sutures. The subcutaneous tissues were closed in two layers using 2-0 Vicryl interrupted sutures before the skin was closed using staples. The portal sites also were closed using staples. A sterile bulky dressing was applied to the shoulder before the arm was placed into a shoulder  immobilizer. The patient was then awakened, extubated, and returned to the recovery room in satisfactory condition after tolerating the procedure well.

## 2016-12-17 NOTE — Progress Notes (Signed)
Can wiggle fingers on right and capillary refill positive to right

## 2016-12-18 ENCOUNTER — Encounter: Payer: Self-pay | Admitting: Surgery

## 2016-12-18 NOTE — Anesthesia Postprocedure Evaluation (Signed)
Anesthesia Post Note  Patient: Patrick Hood  Procedure(s) Performed: Procedure(s) (LRB): SHOULDER ARTHROSCOPY WITH OPEN ROTATOR CUFF REPAIR (Right) BICEPS TENODESIS SUBACROMIAL DECOMPRESSION  Patient location during evaluation: PACU Anesthesia Type: General Level of consciousness: awake and alert Pain management: pain level controlled Vital Signs Assessment: post-procedure vital signs reviewed and stable Respiratory status: spontaneous breathing, nonlabored ventilation, respiratory function stable and patient connected to nasal cannula oxygen Cardiovascular status: blood pressure returned to baseline and stable Postop Assessment: no signs of nausea or vomiting Anesthetic complications: no     Last Vitals:  Vitals:   12/17/16 1330 12/17/16 1410  BP: (!) 162/80 (!) 146/70  Pulse: 63 67  Resp: 16   Temp: (!) 35.8 C     Last Pain:  Vitals:   12/18/16 0830  TempSrc:   PainSc: 2                  Precious Haws Jheremy Boger

## 2016-12-23 DIAGNOSIS — M6281 Muscle weakness (generalized): Secondary | ICD-10-CM | POA: Diagnosis not present

## 2016-12-23 DIAGNOSIS — M25611 Stiffness of right shoulder, not elsewhere classified: Secondary | ICD-10-CM | POA: Diagnosis not present

## 2016-12-23 DIAGNOSIS — M75121 Complete rotator cuff tear or rupture of right shoulder, not specified as traumatic: Secondary | ICD-10-CM | POA: Diagnosis not present

## 2016-12-23 DIAGNOSIS — M25511 Pain in right shoulder: Secondary | ICD-10-CM | POA: Diagnosis not present

## 2016-12-30 DIAGNOSIS — M75121 Complete rotator cuff tear or rupture of right shoulder, not specified as traumatic: Secondary | ICD-10-CM | POA: Diagnosis not present

## 2017-01-08 DIAGNOSIS — M75121 Complete rotator cuff tear or rupture of right shoulder, not specified as traumatic: Secondary | ICD-10-CM | POA: Diagnosis not present

## 2017-01-14 DIAGNOSIS — Z79899 Other long term (current) drug therapy: Secondary | ICD-10-CM | POA: Diagnosis not present

## 2017-01-14 DIAGNOSIS — E782 Mixed hyperlipidemia: Secondary | ICD-10-CM | POA: Diagnosis not present

## 2017-01-14 DIAGNOSIS — M75121 Complete rotator cuff tear or rupture of right shoulder, not specified as traumatic: Secondary | ICD-10-CM | POA: Diagnosis not present

## 2017-01-14 DIAGNOSIS — Z125 Encounter for screening for malignant neoplasm of prostate: Secondary | ICD-10-CM | POA: Diagnosis not present

## 2017-01-14 DIAGNOSIS — Z0000 Encounter for general adult medical examination without abnormal findings: Secondary | ICD-10-CM | POA: Diagnosis not present

## 2017-01-27 DIAGNOSIS — M75121 Complete rotator cuff tear or rupture of right shoulder, not specified as traumatic: Secondary | ICD-10-CM | POA: Diagnosis not present

## 2017-01-29 DIAGNOSIS — M75121 Complete rotator cuff tear or rupture of right shoulder, not specified as traumatic: Secondary | ICD-10-CM | POA: Diagnosis not present

## 2017-01-30 DIAGNOSIS — Z0000 Encounter for general adult medical examination without abnormal findings: Secondary | ICD-10-CM | POA: Diagnosis not present

## 2017-01-30 DIAGNOSIS — R972 Elevated prostate specific antigen [PSA]: Secondary | ICD-10-CM | POA: Diagnosis not present

## 2017-01-30 DIAGNOSIS — Z125 Encounter for screening for malignant neoplasm of prostate: Secondary | ICD-10-CM | POA: Diagnosis not present

## 2017-01-30 DIAGNOSIS — Z79899 Other long term (current) drug therapy: Secondary | ICD-10-CM | POA: Diagnosis not present

## 2017-01-30 DIAGNOSIS — E782 Mixed hyperlipidemia: Secondary | ICD-10-CM | POA: Diagnosis not present

## 2017-02-04 DIAGNOSIS — M75121 Complete rotator cuff tear or rupture of right shoulder, not specified as traumatic: Secondary | ICD-10-CM | POA: Diagnosis not present

## 2017-02-07 DIAGNOSIS — M75121 Complete rotator cuff tear or rupture of right shoulder, not specified as traumatic: Secondary | ICD-10-CM | POA: Diagnosis not present

## 2017-02-10 DIAGNOSIS — M75121 Complete rotator cuff tear or rupture of right shoulder, not specified as traumatic: Secondary | ICD-10-CM | POA: Diagnosis not present

## 2017-02-19 DIAGNOSIS — M75121 Complete rotator cuff tear or rupture of right shoulder, not specified as traumatic: Secondary | ICD-10-CM | POA: Diagnosis not present

## 2017-02-26 DIAGNOSIS — M75121 Complete rotator cuff tear or rupture of right shoulder, not specified as traumatic: Secondary | ICD-10-CM | POA: Diagnosis not present

## 2017-03-12 DIAGNOSIS — M75121 Complete rotator cuff tear or rupture of right shoulder, not specified as traumatic: Secondary | ICD-10-CM | POA: Diagnosis not present

## 2017-03-28 DIAGNOSIS — M7581 Other shoulder lesions, right shoulder: Secondary | ICD-10-CM | POA: Diagnosis not present

## 2017-03-28 DIAGNOSIS — Z9889 Other specified postprocedural states: Secondary | ICD-10-CM | POA: Diagnosis not present

## 2017-04-30 DIAGNOSIS — R972 Elevated prostate specific antigen [PSA]: Secondary | ICD-10-CM | POA: Diagnosis not present

## 2017-05-05 DIAGNOSIS — Z9889 Other specified postprocedural states: Secondary | ICD-10-CM | POA: Diagnosis not present

## 2017-07-16 DIAGNOSIS — J40 Bronchitis, not specified as acute or chronic: Secondary | ICD-10-CM | POA: Diagnosis not present

## 2017-10-05 DIAGNOSIS — H524 Presbyopia: Secondary | ICD-10-CM | POA: Diagnosis not present

## 2017-10-28 DIAGNOSIS — M5413 Radiculopathy, cervicothoracic region: Secondary | ICD-10-CM | POA: Diagnosis not present

## 2017-10-28 DIAGNOSIS — M9901 Segmental and somatic dysfunction of cervical region: Secondary | ICD-10-CM | POA: Diagnosis not present

## 2017-10-29 DIAGNOSIS — M5413 Radiculopathy, cervicothoracic region: Secondary | ICD-10-CM | POA: Diagnosis not present

## 2017-10-29 DIAGNOSIS — M9901 Segmental and somatic dysfunction of cervical region: Secondary | ICD-10-CM | POA: Diagnosis not present

## 2017-10-31 DIAGNOSIS — M5413 Radiculopathy, cervicothoracic region: Secondary | ICD-10-CM | POA: Diagnosis not present

## 2017-10-31 DIAGNOSIS — M9901 Segmental and somatic dysfunction of cervical region: Secondary | ICD-10-CM | POA: Diagnosis not present

## 2017-11-03 DIAGNOSIS — M9901 Segmental and somatic dysfunction of cervical region: Secondary | ICD-10-CM | POA: Diagnosis not present

## 2017-11-03 DIAGNOSIS — M5413 Radiculopathy, cervicothoracic region: Secondary | ICD-10-CM | POA: Diagnosis not present

## 2017-11-06 DIAGNOSIS — M5413 Radiculopathy, cervicothoracic region: Secondary | ICD-10-CM | POA: Diagnosis not present

## 2017-11-06 DIAGNOSIS — M9901 Segmental and somatic dysfunction of cervical region: Secondary | ICD-10-CM | POA: Diagnosis not present

## 2018-01-28 DIAGNOSIS — Z79899 Other long term (current) drug therapy: Secondary | ICD-10-CM | POA: Diagnosis not present

## 2018-01-28 DIAGNOSIS — E782 Mixed hyperlipidemia: Secondary | ICD-10-CM | POA: Diagnosis not present

## 2018-01-28 DIAGNOSIS — Z125 Encounter for screening for malignant neoplasm of prostate: Secondary | ICD-10-CM | POA: Diagnosis not present

## 2018-02-02 DIAGNOSIS — Z125 Encounter for screening for malignant neoplasm of prostate: Secondary | ICD-10-CM | POA: Diagnosis not present

## 2018-02-02 DIAGNOSIS — Z79899 Other long term (current) drug therapy: Secondary | ICD-10-CM | POA: Diagnosis not present

## 2018-02-02 DIAGNOSIS — R319 Hematuria, unspecified: Secondary | ICD-10-CM | POA: Diagnosis not present

## 2018-02-02 DIAGNOSIS — Z0000 Encounter for general adult medical examination without abnormal findings: Secondary | ICD-10-CM | POA: Diagnosis not present

## 2018-02-02 DIAGNOSIS — E782 Mixed hyperlipidemia: Secondary | ICD-10-CM | POA: Diagnosis not present

## 2018-05-23 IMAGING — US US CAROTID DUPLEX BILAT
1 series · 13 of 24 positions shown · non-contrast
Comparison: CTA 09/02/2006

CLINICAL DATA: 75-year-old male with a history of bilateral carotid
artery stenosis.

Cardiovascular risk factors include hypertension, hyperlipidemia.
EXAM:
BILATERAL CAROTID DUPLEX ULTRASOUND
TECHNIQUE: Gray scale imaging, color Doppler and duplex ultrasound were
performed of bilateral carotid and vertebral arteries in the neck.

[Series 1: us carotid duplex bilat · 13 of 70 slices shown]
[im 1/70]
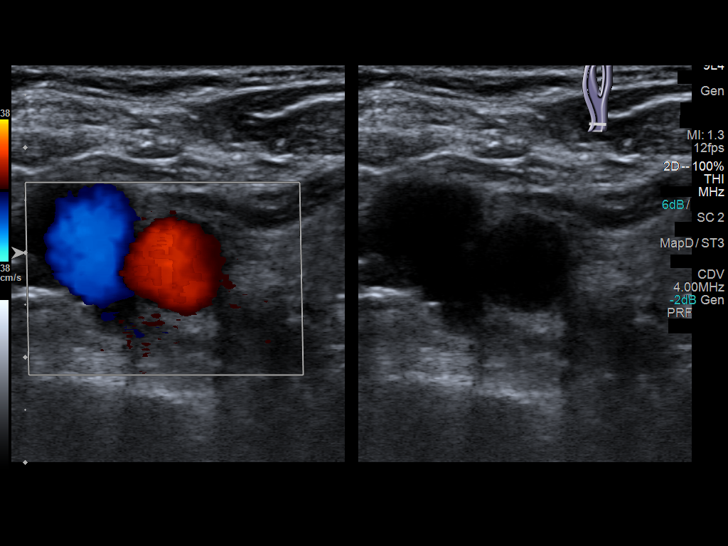
[im 7/70]
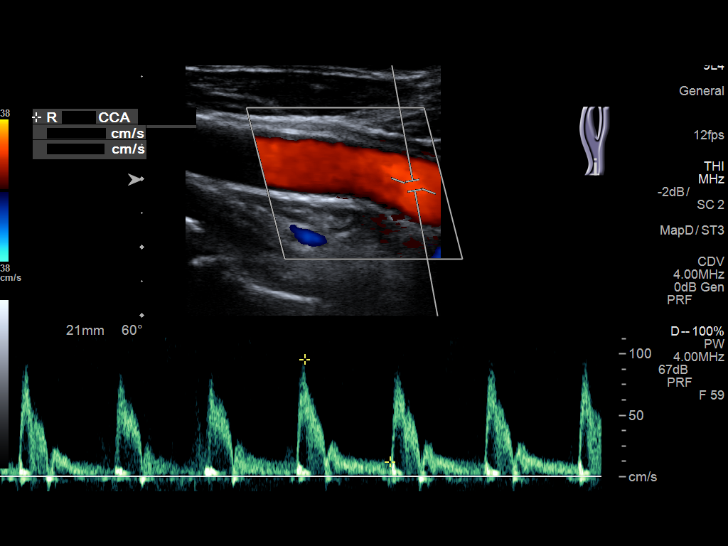
[im 13/70]
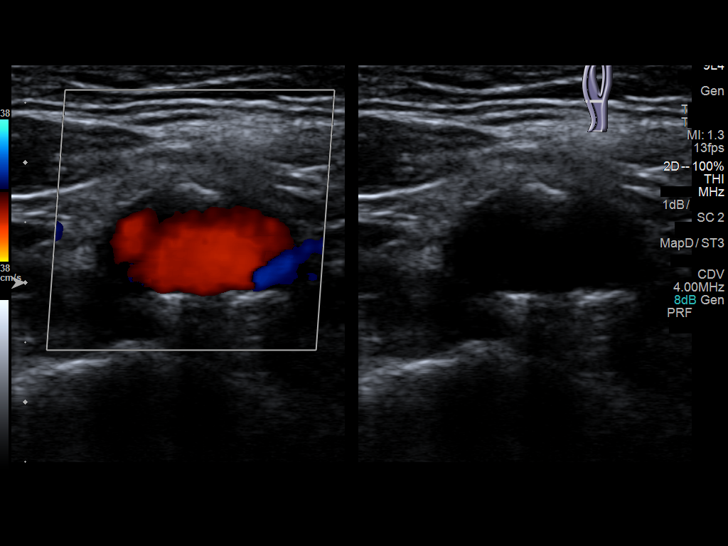
[im 19/70]
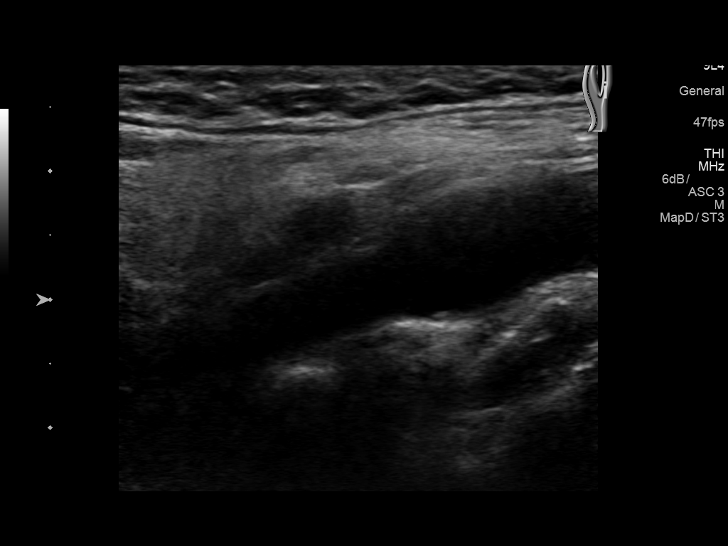
[im 25/70]
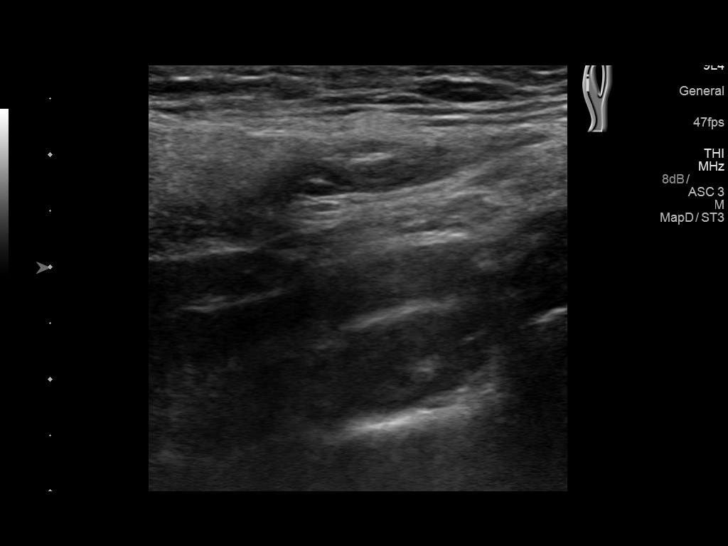
[im 31/70]
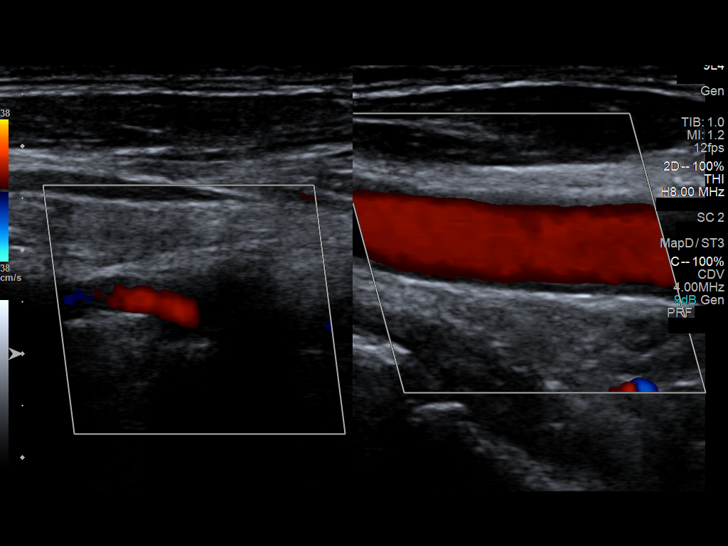
[im 37/70]
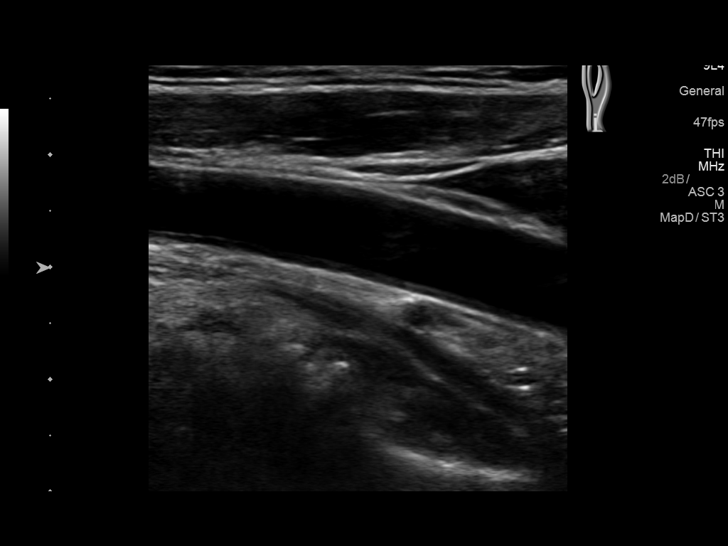
[im 40/70]
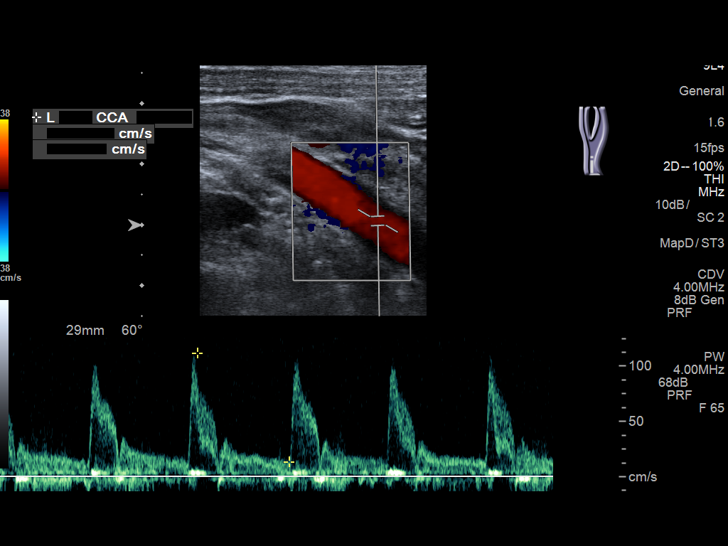
[im 46/70]
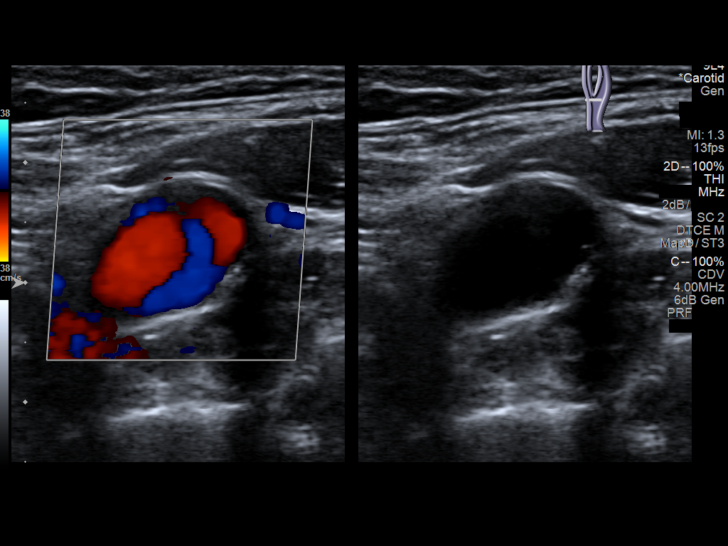
[im 52/70]
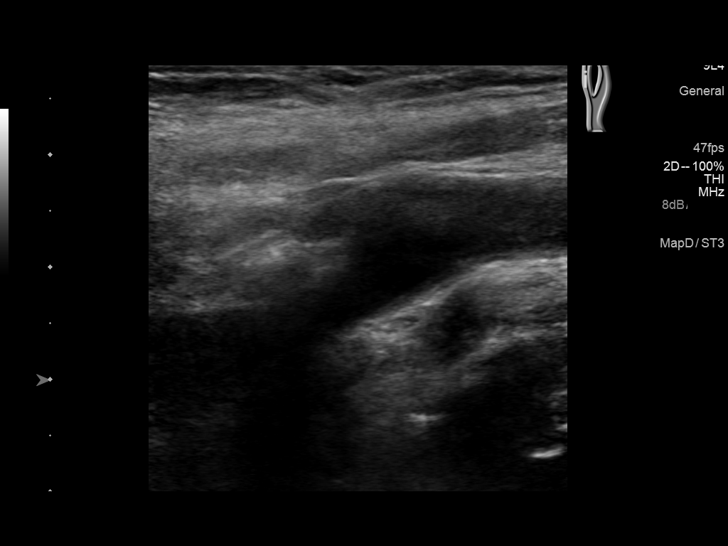
[im 58/70]
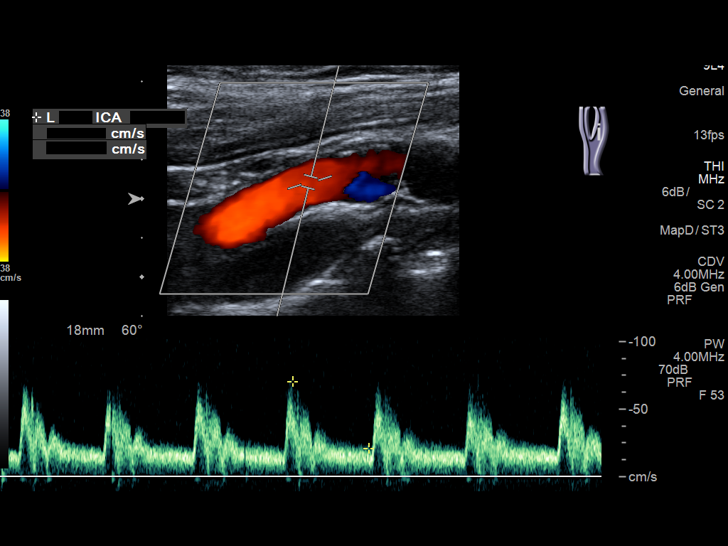
[im 64/70]
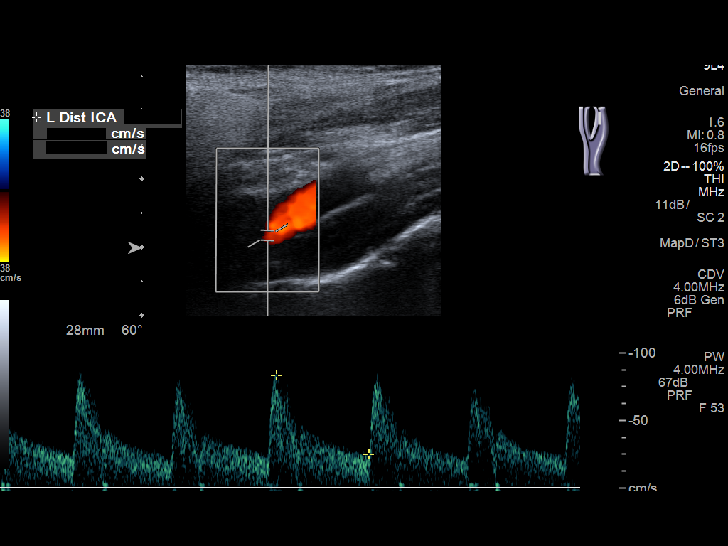
[im 70/70]
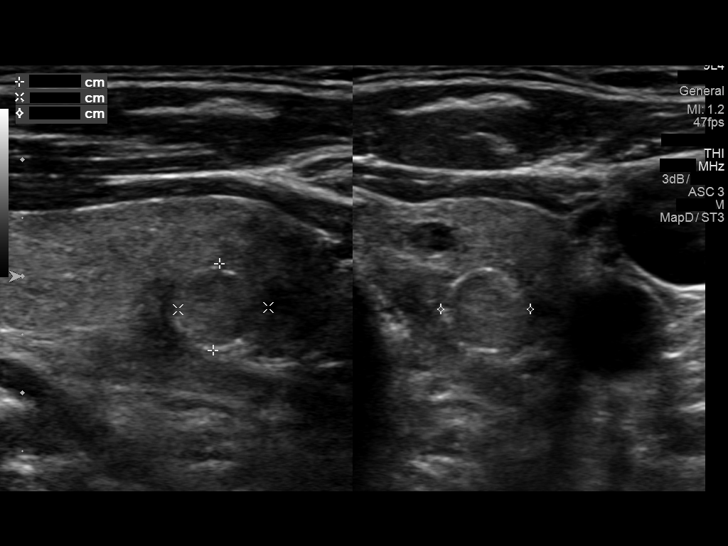

[13 of 24 positions shown; findings below may reference images not displayed]

FINDINGS: Criteria: Quantification of carotid stenosis is based on velocity
parameters that correlate the residual internal carotid diameter
with NASCET-based stenosis levels, using the diameter of the distal
internal carotid lumen as the denominator for stenosis measurement.

The following velocity measurements were obtained:

RIGHT

ICA:  Systolic 92 cm/sec, Diastolic 23 cm/sec

CCA:  94 cm/sec

SYSTOLIC ICA/CCA RATIO:

ECA:  115 cm/sec

LEFT

ICA:  Systolic 84 cm/sec, Diastolic 25 cm/sec

CCA:  101 cm/sec

SYSTOLIC ICA/CCA RATIO:

ECA:  73 cm/sec

Right Brachial SBP: Not acquired

Left Brachial SBP: Not acquired

RIGHT CAROTID ARTERY: No significant calcified disease of the right
common carotid artery. Intermediate waveform maintained.
Heterogeneous plaque without significant calcifications at the right
carotid bifurcation. Low resistance waveform of the right ICA. No
significant tortuosity.

RIGHT VERTEBRAL ARTERY: Antegrade flow with low resistance waveform.

LEFT CAROTID ARTERY: No significant calcified disease of the left
common carotid artery. Intermediate waveform maintained.
Heterogeneous plaque at the left carotid bifurcation without
significant calcifications. Low resistance waveform of the left ICA.

LEFT VERTEBRAL ARTERY:  Antegrade flow with low resistance waveform.

Other: Single rounded rim calcified thyroid nodule with overall
benign features measures less than 8 mm.
IMPRESSION: Color duplex indicates minimal heterogeneous plaque, with no
hemodynamically significant stenosis by duplex criteria in the
extracranial cerebrovascular circulation.

## 2018-05-25 DIAGNOSIS — L821 Other seborrheic keratosis: Secondary | ICD-10-CM | POA: Diagnosis not present

## 2018-05-25 DIAGNOSIS — D2261 Melanocytic nevi of right upper limb, including shoulder: Secondary | ICD-10-CM | POA: Diagnosis not present

## 2018-05-25 DIAGNOSIS — D485 Neoplasm of uncertain behavior of skin: Secondary | ICD-10-CM | POA: Diagnosis not present

## 2018-05-25 DIAGNOSIS — D225 Melanocytic nevi of trunk: Secondary | ICD-10-CM | POA: Diagnosis not present

## 2018-05-25 DIAGNOSIS — X32XXXA Exposure to sunlight, initial encounter: Secondary | ICD-10-CM | POA: Diagnosis not present

## 2018-05-25 DIAGNOSIS — D2262 Melanocytic nevi of left upper limb, including shoulder: Secondary | ICD-10-CM | POA: Diagnosis not present

## 2018-05-25 DIAGNOSIS — L570 Actinic keratosis: Secondary | ICD-10-CM | POA: Diagnosis not present

## 2018-05-25 DIAGNOSIS — D2271 Melanocytic nevi of right lower limb, including hip: Secondary | ICD-10-CM | POA: Diagnosis not present

## 2018-05-25 DIAGNOSIS — D2272 Melanocytic nevi of left lower limb, including hip: Secondary | ICD-10-CM | POA: Diagnosis not present

## 2018-05-25 DIAGNOSIS — C4359 Malignant melanoma of other part of trunk: Secondary | ICD-10-CM | POA: Diagnosis not present

## 2018-06-10 DIAGNOSIS — C4359 Malignant melanoma of other part of trunk: Secondary | ICD-10-CM | POA: Diagnosis not present

## 2018-06-10 DIAGNOSIS — D0359 Melanoma in situ of other part of trunk: Secondary | ICD-10-CM | POA: Diagnosis not present

## 2018-07-03 DIAGNOSIS — R05 Cough: Secondary | ICD-10-CM | POA: Diagnosis not present

## 2018-08-04 DIAGNOSIS — J40 Bronchitis, not specified as acute or chronic: Secondary | ICD-10-CM | POA: Diagnosis not present

## 2018-08-24 IMAGING — MR MR MRA HEAD W/O CM
1 series · 23 of 48 positions shown · non-contrast
Comparison: Neck CT 09/02/2006.

CLINICAL DATA: 75-year-old male with left ear pulsatile tinnitus
for 3 months. No known injury. Initial encounter.

EXAM:
MRA HEAD WITHOUT CONTRAST
TECHNIQUE: Angiographic images of the Circle of Willis were obtained using MRA
technique without intravenous contrast.

[Series 2: TOF · axial · non-contrast · 0.7mm · 0.37mm/px · z∈[-36,+55]mm · 23 of 143 slices shown]
[im 1/143]
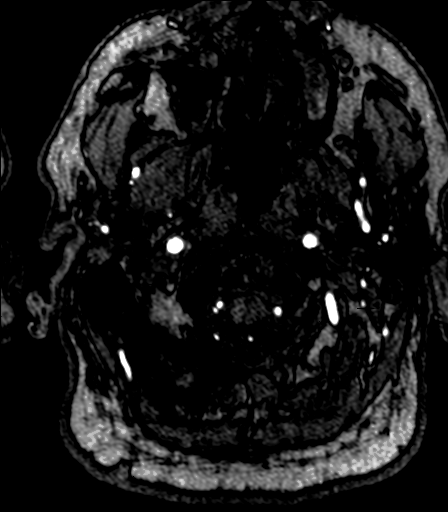
[im 4/143]
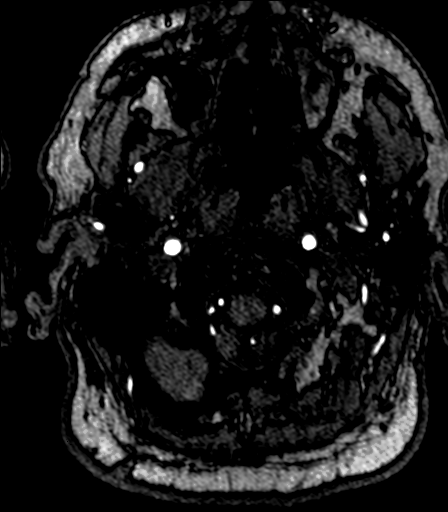
[im 7/143]
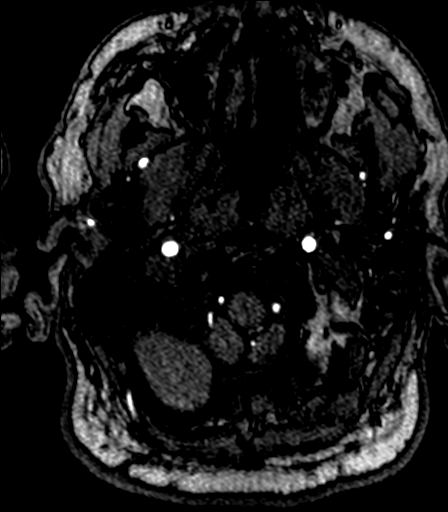
[im 10/143]
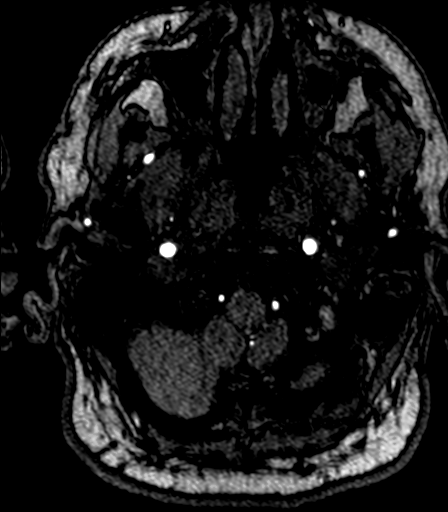
[im 13/143]
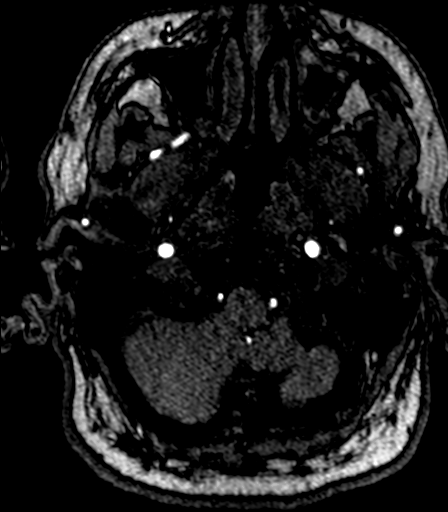
[im 16/143]
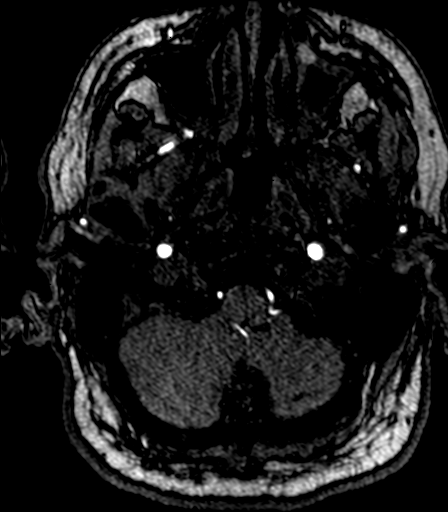
[im 19/143]
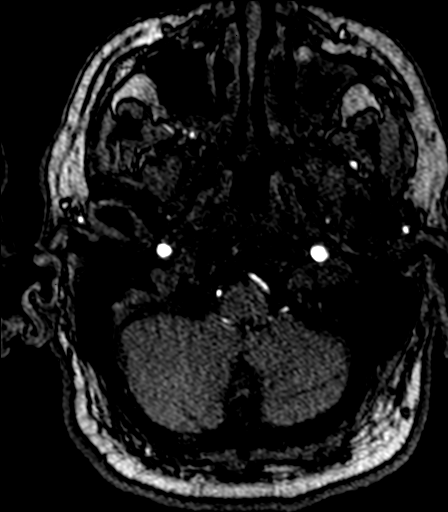
[im 22/143]
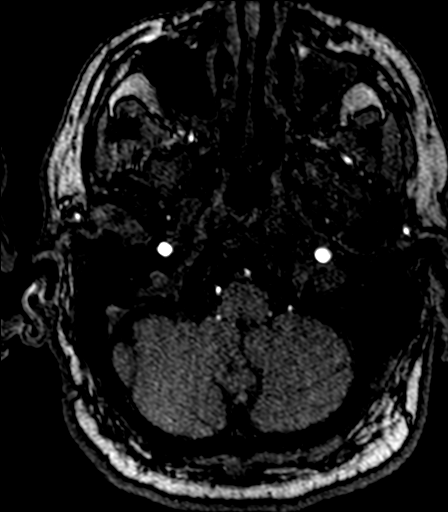
[im 25/143]
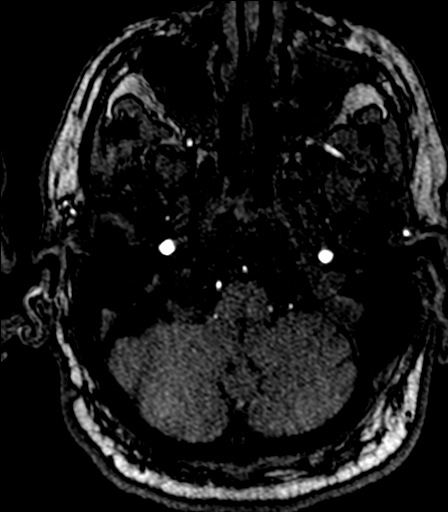
[im 28/143]
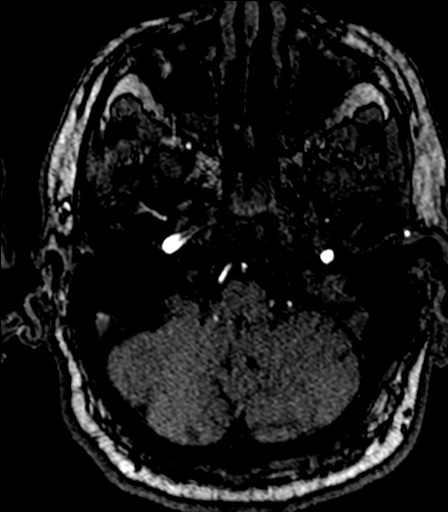
[im 31/143]
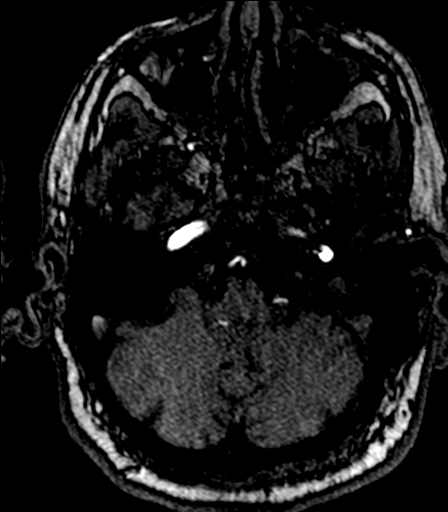
[im 34/143]
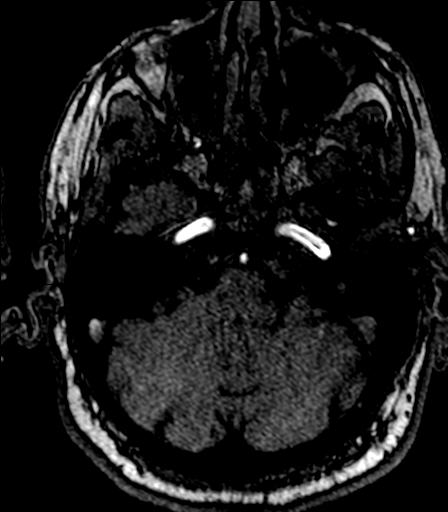
[im 37/143]
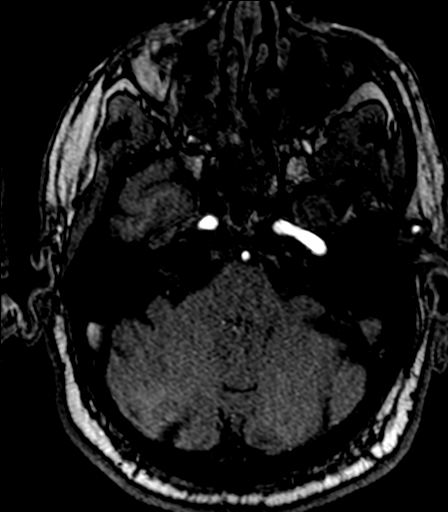
[im 40/143]
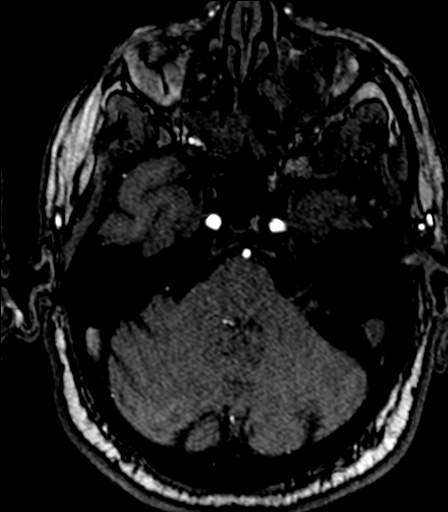
[im 43/143]
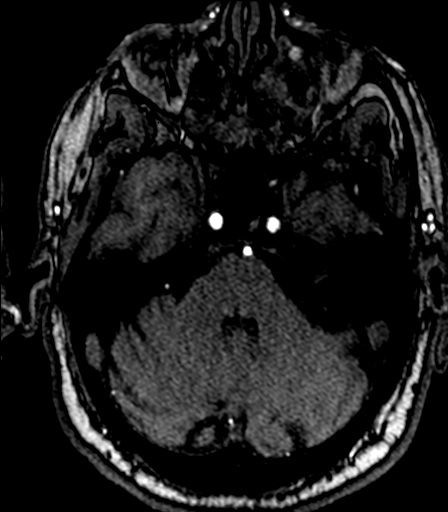
[im 46/143]
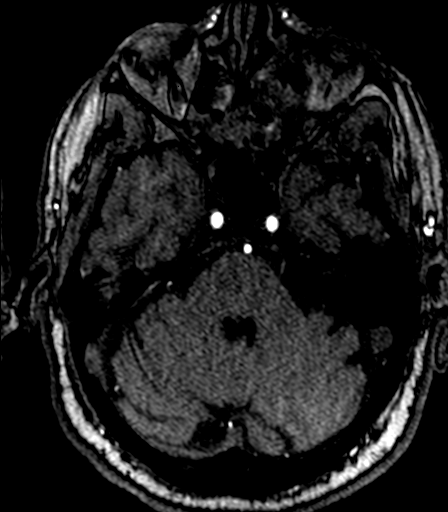
[im 64/143]
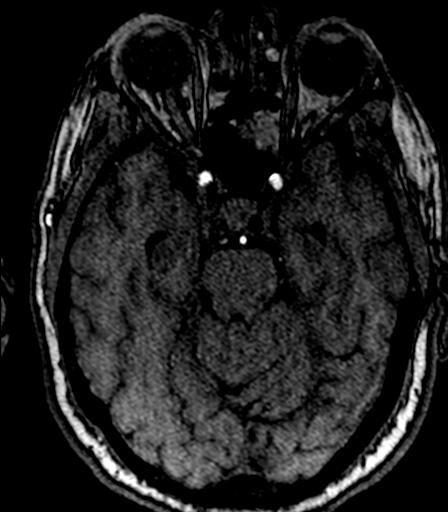
[im 73/143]
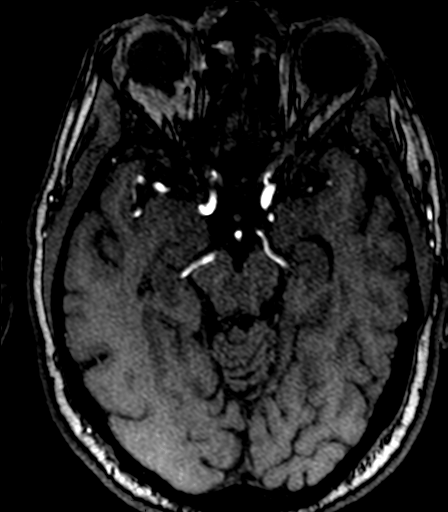
[im 82/143]
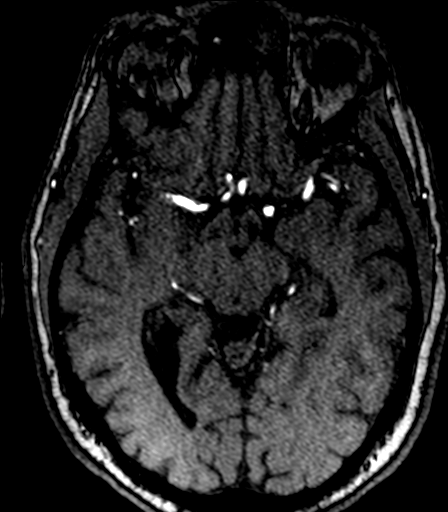
[im 100/143]
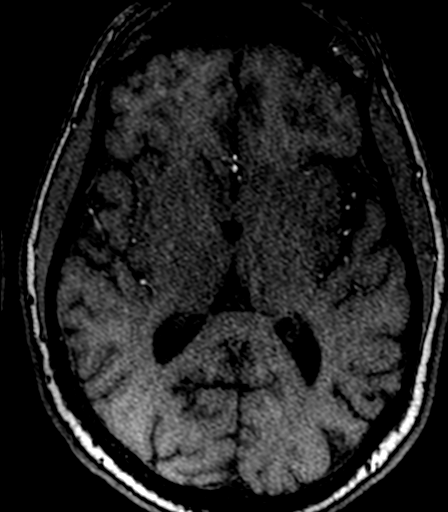
[im 118/143]
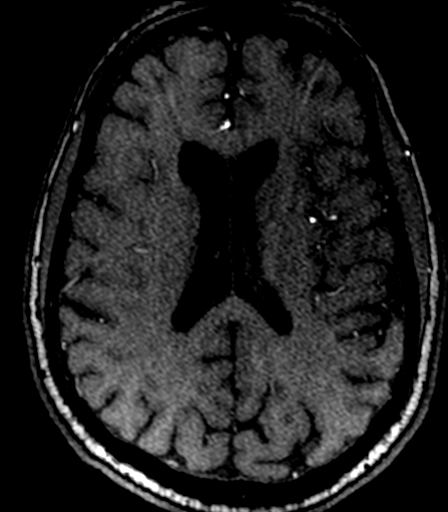
[im 121/143]
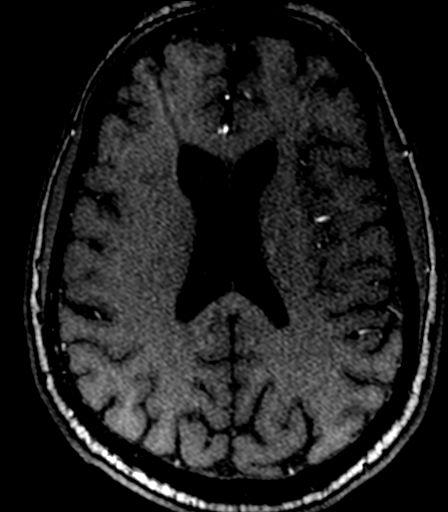
[im 136/143]
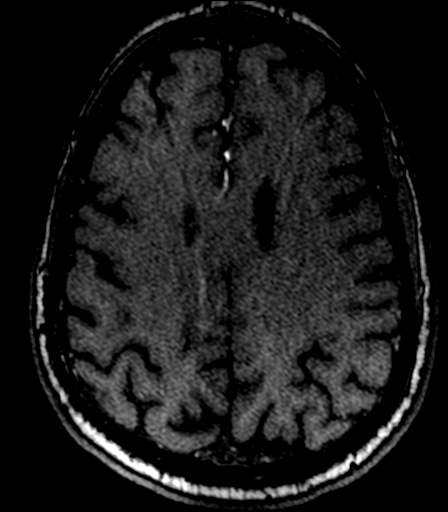

[23 of 48 positions shown; findings below may reference images not displayed]

FINDINGS: No intracranial mass effect or ventriculomegaly is evident.

Antegrade flow in the posterior circulation. Codominant distal
vertebral arteries without stenosis. Preserved bilateral PICA flow.
The left PICA origin is normal. Vertebrobasilar junction and basilar
artery are within normal limits. Fetal type bilateral PCA origins,
more so the right. Tortuous left P1. Normal SCA origins. Normal
bilateral PCA branches.

No abnormal flow signal about the left temporal bone.

Antegrade flow in both ICA siphons. No siphon stenosis. Ophthalmic
and posterior communicating artery origins are normal. Normal
carotid termini, MCA and ACA origins. Diminutive or absent anterior
communicating artery. Visualized ACA branches are within normal
limits. MCA M1 segments are within normal limits. Left MCA
trifurcation and right MCA bifurcation are normal. Visualized
bilateral MCA branches are within normal limits.
IMPRESSION: Normal intracranial MRA. No MRA explanation for left pulsatile
tinnitus.

## 2018-09-17 DIAGNOSIS — K219 Gastro-esophageal reflux disease without esophagitis: Secondary | ICD-10-CM | POA: Diagnosis not present

## 2018-09-17 DIAGNOSIS — J4531 Mild persistent asthma with (acute) exacerbation: Secondary | ICD-10-CM | POA: Diagnosis not present

## 2018-09-23 DIAGNOSIS — K219 Gastro-esophageal reflux disease without esophagitis: Secondary | ICD-10-CM | POA: Diagnosis not present

## 2018-09-23 DIAGNOSIS — R05 Cough: Secondary | ICD-10-CM | POA: Diagnosis not present

## 2018-09-23 DIAGNOSIS — Z720 Tobacco use: Secondary | ICD-10-CM | POA: Diagnosis not present

## 2018-09-23 DIAGNOSIS — J4520 Mild intermittent asthma, uncomplicated: Secondary | ICD-10-CM | POA: Diagnosis not present

## 2018-09-23 DIAGNOSIS — H93A9 Pulsatile tinnitus, unspecified ear: Secondary | ICD-10-CM | POA: Diagnosis not present

## 2018-12-08 DIAGNOSIS — H524 Presbyopia: Secondary | ICD-10-CM | POA: Diagnosis not present

## 2018-12-08 DIAGNOSIS — Z0100 Encounter for examination of eyes and vision without abnormal findings: Secondary | ICD-10-CM | POA: Diagnosis not present

## 2018-12-15 DIAGNOSIS — K219 Gastro-esophageal reflux disease without esophagitis: Secondary | ICD-10-CM | POA: Diagnosis not present

## 2018-12-15 DIAGNOSIS — R05 Cough: Secondary | ICD-10-CM | POA: Diagnosis not present

## 2019-01-26 DIAGNOSIS — E782 Mixed hyperlipidemia: Secondary | ICD-10-CM | POA: Diagnosis not present

## 2019-01-26 DIAGNOSIS — Z79899 Other long term (current) drug therapy: Secondary | ICD-10-CM | POA: Diagnosis not present

## 2019-01-26 DIAGNOSIS — Z125 Encounter for screening for malignant neoplasm of prostate: Secondary | ICD-10-CM | POA: Diagnosis not present

## 2019-02-05 DIAGNOSIS — Z23 Encounter for immunization: Secondary | ICD-10-CM | POA: Diagnosis not present

## 2019-02-05 DIAGNOSIS — R972 Elevated prostate specific antigen [PSA]: Secondary | ICD-10-CM | POA: Diagnosis not present

## 2019-02-05 DIAGNOSIS — Z0000 Encounter for general adult medical examination without abnormal findings: Secondary | ICD-10-CM | POA: Diagnosis not present

## 2019-02-05 DIAGNOSIS — Z125 Encounter for screening for malignant neoplasm of prostate: Secondary | ICD-10-CM | POA: Diagnosis not present

## 2019-02-05 DIAGNOSIS — E782 Mixed hyperlipidemia: Secondary | ICD-10-CM | POA: Diagnosis not present

## 2019-04-09 DIAGNOSIS — R972 Elevated prostate specific antigen [PSA]: Secondary | ICD-10-CM | POA: Diagnosis not present

## 2019-05-17 DIAGNOSIS — D225 Melanocytic nevi of trunk: Secondary | ICD-10-CM | POA: Diagnosis not present

## 2019-05-17 DIAGNOSIS — L820 Inflamed seborrheic keratosis: Secondary | ICD-10-CM | POA: Diagnosis not present

## 2019-05-17 DIAGNOSIS — L570 Actinic keratosis: Secondary | ICD-10-CM | POA: Diagnosis not present

## 2019-05-17 DIAGNOSIS — D2262 Melanocytic nevi of left upper limb, including shoulder: Secondary | ICD-10-CM | POA: Diagnosis not present

## 2019-05-17 DIAGNOSIS — D2261 Melanocytic nevi of right upper limb, including shoulder: Secondary | ICD-10-CM | POA: Diagnosis not present

## 2019-05-17 DIAGNOSIS — X32XXXA Exposure to sunlight, initial encounter: Secondary | ICD-10-CM | POA: Diagnosis not present

## 2019-05-17 DIAGNOSIS — D2271 Melanocytic nevi of right lower limb, including hip: Secondary | ICD-10-CM | POA: Diagnosis not present

## 2019-05-17 DIAGNOSIS — D2272 Melanocytic nevi of left lower limb, including hip: Secondary | ICD-10-CM | POA: Diagnosis not present

## 2019-07-08 DIAGNOSIS — R972 Elevated prostate specific antigen [PSA]: Secondary | ICD-10-CM | POA: Diagnosis not present

## 2019-12-10 DIAGNOSIS — H524 Presbyopia: Secondary | ICD-10-CM | POA: Diagnosis not present

## 2019-12-16 DIAGNOSIS — M9903 Segmental and somatic dysfunction of lumbar region: Secondary | ICD-10-CM | POA: Diagnosis not present

## 2019-12-16 DIAGNOSIS — M545 Low back pain: Secondary | ICD-10-CM | POA: Diagnosis not present

## 2019-12-17 DIAGNOSIS — M545 Low back pain: Secondary | ICD-10-CM | POA: Diagnosis not present

## 2019-12-17 DIAGNOSIS — M9903 Segmental and somatic dysfunction of lumbar region: Secondary | ICD-10-CM | POA: Diagnosis not present

## 2019-12-21 DIAGNOSIS — M9903 Segmental and somatic dysfunction of lumbar region: Secondary | ICD-10-CM | POA: Diagnosis not present

## 2019-12-21 DIAGNOSIS — M545 Low back pain: Secondary | ICD-10-CM | POA: Diagnosis not present

## 2019-12-24 DIAGNOSIS — M545 Low back pain: Secondary | ICD-10-CM | POA: Diagnosis not present

## 2019-12-24 DIAGNOSIS — M9903 Segmental and somatic dysfunction of lumbar region: Secondary | ICD-10-CM | POA: Diagnosis not present

## 2019-12-27 DIAGNOSIS — M545 Low back pain: Secondary | ICD-10-CM | POA: Diagnosis not present

## 2019-12-27 DIAGNOSIS — M9903 Segmental and somatic dysfunction of lumbar region: Secondary | ICD-10-CM | POA: Diagnosis not present

## 2019-12-30 DIAGNOSIS — M545 Low back pain: Secondary | ICD-10-CM | POA: Diagnosis not present

## 2019-12-30 DIAGNOSIS — M9903 Segmental and somatic dysfunction of lumbar region: Secondary | ICD-10-CM | POA: Diagnosis not present

## 2020-01-06 DIAGNOSIS — M9903 Segmental and somatic dysfunction of lumbar region: Secondary | ICD-10-CM | POA: Diagnosis not present

## 2020-01-06 DIAGNOSIS — M545 Low back pain: Secondary | ICD-10-CM | POA: Diagnosis not present

## 2020-01-10 DIAGNOSIS — M9903 Segmental and somatic dysfunction of lumbar region: Secondary | ICD-10-CM | POA: Diagnosis not present

## 2020-01-10 DIAGNOSIS — M545 Low back pain: Secondary | ICD-10-CM | POA: Diagnosis not present

## 2020-01-31 DIAGNOSIS — E782 Mixed hyperlipidemia: Secondary | ICD-10-CM | POA: Diagnosis not present

## 2020-01-31 DIAGNOSIS — Z125 Encounter for screening for malignant neoplasm of prostate: Secondary | ICD-10-CM | POA: Diagnosis not present

## 2020-02-07 DIAGNOSIS — Z125 Encounter for screening for malignant neoplasm of prostate: Secondary | ICD-10-CM | POA: Diagnosis not present

## 2020-02-07 DIAGNOSIS — Z0000 Encounter for general adult medical examination without abnormal findings: Secondary | ICD-10-CM | POA: Diagnosis not present

## 2020-02-07 DIAGNOSIS — K219 Gastro-esophageal reflux disease without esophagitis: Secondary | ICD-10-CM | POA: Diagnosis not present

## 2020-02-07 DIAGNOSIS — E782 Mixed hyperlipidemia: Secondary | ICD-10-CM | POA: Diagnosis not present

## 2020-08-14 DIAGNOSIS — L570 Actinic keratosis: Secondary | ICD-10-CM | POA: Diagnosis not present

## 2020-08-14 DIAGNOSIS — L308 Other specified dermatitis: Secondary | ICD-10-CM | POA: Diagnosis not present

## 2020-08-14 DIAGNOSIS — Z85820 Personal history of malignant melanoma of skin: Secondary | ICD-10-CM | POA: Diagnosis not present

## 2020-08-14 DIAGNOSIS — D2262 Melanocytic nevi of left upper limb, including shoulder: Secondary | ICD-10-CM | POA: Diagnosis not present

## 2020-08-14 DIAGNOSIS — D2261 Melanocytic nevi of right upper limb, including shoulder: Secondary | ICD-10-CM | POA: Diagnosis not present

## 2020-08-14 DIAGNOSIS — D2272 Melanocytic nevi of left lower limb, including hip: Secondary | ICD-10-CM | POA: Diagnosis not present

## 2020-08-14 DIAGNOSIS — D225 Melanocytic nevi of trunk: Secondary | ICD-10-CM | POA: Diagnosis not present

## 2020-08-14 DIAGNOSIS — X32XXXA Exposure to sunlight, initial encounter: Secondary | ICD-10-CM | POA: Diagnosis not present

## 2020-08-31 DIAGNOSIS — K219 Gastro-esophageal reflux disease without esophagitis: Secondary | ICD-10-CM | POA: Diagnosis not present

## 2020-08-31 DIAGNOSIS — M542 Cervicalgia: Secondary | ICD-10-CM | POA: Diagnosis not present

## 2020-11-28 DIAGNOSIS — M542 Cervicalgia: Secondary | ICD-10-CM | POA: Diagnosis not present

## 2020-11-28 DIAGNOSIS — G2581 Restless legs syndrome: Secondary | ICD-10-CM | POA: Diagnosis not present

## 2021-01-09 DIAGNOSIS — H524 Presbyopia: Secondary | ICD-10-CM | POA: Diagnosis not present

## 2021-01-09 DIAGNOSIS — Z0100 Encounter for examination of eyes and vision without abnormal findings: Secondary | ICD-10-CM | POA: Diagnosis not present

## 2021-01-12 DIAGNOSIS — R053 Chronic cough: Secondary | ICD-10-CM | POA: Diagnosis not present

## 2021-01-12 DIAGNOSIS — K219 Gastro-esophageal reflux disease without esophagitis: Secondary | ICD-10-CM | POA: Diagnosis not present

## 2021-01-12 DIAGNOSIS — R059 Cough, unspecified: Secondary | ICD-10-CM | POA: Diagnosis not present

## 2021-02-05 ENCOUNTER — Other Ambulatory Visit (HOSPITAL_COMMUNITY): Payer: Self-pay | Admitting: Pulmonary Disease

## 2021-02-05 ENCOUNTER — Other Ambulatory Visit: Payer: Self-pay | Admitting: Pulmonary Disease

## 2021-02-05 DIAGNOSIS — Z125 Encounter for screening for malignant neoplasm of prostate: Secondary | ICD-10-CM | POA: Diagnosis not present

## 2021-02-05 DIAGNOSIS — E782 Mixed hyperlipidemia: Secondary | ICD-10-CM | POA: Diagnosis not present

## 2021-02-05 DIAGNOSIS — R053 Chronic cough: Secondary | ICD-10-CM

## 2021-02-07 ENCOUNTER — Ambulatory Visit
Admission: RE | Admit: 2021-02-07 | Discharge: 2021-02-07 | Disposition: A | Discharge status: Home / Self Care | Payer: Medicare HMO | Source: Ambulatory Visit | Attending: Pulmonary Disease | Admitting: Pulmonary Disease

## 2021-02-07 ENCOUNTER — Other Ambulatory Visit: Payer: Self-pay

## 2021-02-07 DIAGNOSIS — R053 Chronic cough: Secondary | ICD-10-CM

## 2021-02-07 DIAGNOSIS — R059 Cough, unspecified: Secondary | ICD-10-CM | POA: Diagnosis not present

## 2021-02-12 ENCOUNTER — Other Ambulatory Visit: Payer: Self-pay | Admitting: Internal Medicine

## 2021-02-12 ENCOUNTER — Other Ambulatory Visit (HOSPITAL_COMMUNITY): Payer: Self-pay | Admitting: Internal Medicine

## 2021-02-12 DIAGNOSIS — R109 Unspecified abdominal pain: Secondary | ICD-10-CM

## 2021-02-12 DIAGNOSIS — Z1389 Encounter for screening for other disorder: Secondary | ICD-10-CM | POA: Diagnosis not present

## 2021-02-12 DIAGNOSIS — R634 Abnormal weight loss: Secondary | ICD-10-CM | POA: Diagnosis not present

## 2021-02-12 DIAGNOSIS — Z125 Encounter for screening for malignant neoplasm of prostate: Secondary | ICD-10-CM | POA: Diagnosis not present

## 2021-02-12 DIAGNOSIS — Z0000 Encounter for general adult medical examination without abnormal findings: Secondary | ICD-10-CM | POA: Diagnosis not present

## 2021-02-12 DIAGNOSIS — E782 Mixed hyperlipidemia: Secondary | ICD-10-CM | POA: Diagnosis not present

## 2021-02-12 DIAGNOSIS — R1084 Generalized abdominal pain: Secondary | ICD-10-CM | POA: Diagnosis not present

## 2021-02-12 DIAGNOSIS — R000 Tachycardia, unspecified: Secondary | ICD-10-CM | POA: Diagnosis not present

## 2021-02-12 DIAGNOSIS — R053 Chronic cough: Secondary | ICD-10-CM

## 2021-02-16 ENCOUNTER — Other Ambulatory Visit: Payer: Self-pay

## 2021-02-16 ENCOUNTER — Ambulatory Visit
Admission: RE | Admit: 2021-02-16 | Discharge: 2021-02-16 | Disposition: A | Discharge status: Home / Self Care | Payer: Medicare HMO | Source: Ambulatory Visit | Attending: Internal Medicine | Admitting: Internal Medicine

## 2021-02-16 DIAGNOSIS — R053 Chronic cough: Secondary | ICD-10-CM | POA: Diagnosis not present

## 2021-02-16 DIAGNOSIS — I2510 Atherosclerotic heart disease of native coronary artery without angina pectoris: Secondary | ICD-10-CM | POA: Diagnosis not present

## 2021-02-16 DIAGNOSIS — R059 Cough, unspecified: Secondary | ICD-10-CM | POA: Diagnosis not present

## 2021-02-16 DIAGNOSIS — I700 Atherosclerosis of aorta: Secondary | ICD-10-CM | POA: Diagnosis not present

## 2021-02-16 DIAGNOSIS — N200 Calculus of kidney: Secondary | ICD-10-CM | POA: Diagnosis not present

## 2021-02-16 DIAGNOSIS — R634 Abnormal weight loss: Secondary | ICD-10-CM | POA: Insufficient documentation

## 2021-02-16 DIAGNOSIS — K4090 Unilateral inguinal hernia, without obstruction or gangrene, not specified as recurrent: Secondary | ICD-10-CM | POA: Diagnosis not present

## 2021-02-16 DIAGNOSIS — R109 Unspecified abdominal pain: Secondary | ICD-10-CM | POA: Diagnosis not present

## 2021-02-16 MED ORDER — IOHEXOL 350 MG/ML SOLN
100.0000 mL | Freq: Once | INTRAVENOUS | Status: AC | PRN
  Administered 2021-02-16: 100 mL via INTRAVENOUS

## 2021-02-21 DIAGNOSIS — R053 Chronic cough: Secondary | ICD-10-CM | POA: Diagnosis not present

## 2021-02-22 DIAGNOSIS — K219 Gastro-esophageal reflux disease without esophagitis: Secondary | ICD-10-CM | POA: Diagnosis not present

## 2021-02-28 DIAGNOSIS — K219 Gastro-esophageal reflux disease without esophagitis: Secondary | ICD-10-CM | POA: Diagnosis not present

## 2021-04-24 DIAGNOSIS — K219 Gastro-esophageal reflux disease without esophagitis: Secondary | ICD-10-CM | POA: Diagnosis not present

## 2021-04-24 NOTE — Progress Notes (Signed)
Sent message, via epic in basket, requesting orders in epic from surgeon.  

## 2021-04-27 NOTE — Progress Notes (Addendum)
COVID swab appointment: 05/11/21  COVID Vaccine Completed: yes x5 Date COVID Vaccine completed: Has received booster: COVID vaccine manufacturer: Pfizer    Moderna     Date of COVID positive in last 90 days: no  PCP - Emily Filbert, MD Cardiologist - n/a  Chest x-ray - CT 02/16/21 Epic EKG - 02/12/21 requ [facility] Stress Test - n/a ECHO - yes this year, with Dr. Sabra Heck Cardiac Cath - n/a Pacemaker/ICD device last checked: n/a Spinal Cord Stimulator: n/a  Sleep Study - n/a CPAP -   Fasting Blood Sugar - n/a Checks Blood Sugar _____ times a day  Blood Thinner Instructions: n/a Aspirin Instructions: Last Dose:  Activity level: Can go up a flight of stairs and perform activities of daily living without stopping and without symptoms of chest pain or shortness of breath.       Anesthesia review:   Patient denies shortness of breath, fever, cough and chest pain at PAT appointment   Patient verbalized understanding of instructions that were given to them at the PAT appointment. Patient was also instructed that they will need to review over the PAT instructions again at home before surgery.

## 2021-04-27 NOTE — Patient Instructions (Addendum)
DUE TO COVID-19 ONLY ONE VISITOR IS ALLOWED TO COME WITH YOU AND STAY IN THE WAITING ROOM ONLY DURING PRE OP AND PROCEDURE.   **NO VISITORS ARE ALLOWED IN THE SHORT STAY AREA OR RECOVERY ROOM!!**  IF YOU WILL BE ADMITTED INTO THE HOSPITAL YOU ARE ALLOWED ONLY TWO SUPPORT PEOPLE DURING VISITATION HOURS ONLY (10AM -8PM)   The support person(s) may change daily. The support person(s) must pass our screening, gel in and out, and wear a mask at all times, including in the patient's room. Patients must also wear a mask when staff or their support person are in the room.  No visitors under the age of 70. Any visitor under the age of 105 must be accompanied by an adult.    COVID SWAB TESTING MUST BE COMPLETED ON:  05/11/21 **MUST PRESENT COMPLETED FORM AT TESTING SITE**    [place] (backside of the building) No appointment needed. Open 8am-3pm. You are not required to quarantine, however you are required to wear a well-fitted mask when you are out and around people not in your household.  Hand Hygiene often Do NOT share personal items Notify your provider if you are in close contact with someone who has COVID or you develop fever 100.4 or greater, new onset of sneezing, cough, sore throat, shortness of breath or body aches.       Your procedure is scheduled on: 05/15/21   Report to [facility] Main Entrance   Report to Short Stay at 5:15 AM   [facility])   Call this number if you have problems the morning of surgery [phone]   Do not eat food :After Midnight.   May have liquids until 4:30 AM day of surgery  CLEAR LIQUID DIET  Foods Allowed                                                                     Foods Excluded  Water, Black Coffee and tea ( no milk or creamer)          liquids that you cannot  Plain Jell-O in any flavor  (No red)                                    see through such as: Fruit ices (not with fruit pulp)                                             milk, soups, orange juice              Iced Popsicles (No red)                                               All solid food  Apple juices Sports drinks like Gatorade (No red) Lightly seasoned clear broth or consume(fat free) Sugar     Oral Hygiene is also important to reduce your risk of infection.                                    Remember - BRUSH YOUR TEETH THE MORNING OF SURGERY WITH YOUR REGULAR TOOTHPASTE   Take these medicines the morning of surgery with A SIP OF WATER: Metoprolol, Pantoprazole.                              You may not have any metal on your body including jewelry, and body piercing             Do not wear lotions, powders, cologne, or deodorant              Men may shave face and neck.   Do not bring valuables to the hospital. [place].   Contacts, dentures or bridgework may not be worn into surgery.   Bring small overnight bag day of surgery.  Special Instructions: Bring a copy of your healthcare power of attorney and living will documents         the day of surgery if you haven't scanned them before.   Please read over the following fact sheets you were given: IF YOU HAVE QUESTIONS ABOUT YOUR PRE-OP INSTRUCTIONS PLEASE CALL [place] - Preparing for Surgery Before surgery, you can play an important role.  Because skin is not sterile, your skin needs to be as free of germs as possible.  You can reduce the number of germs on your skin by washing with CHG (chlorahexidine gluconate) soap before surgery.  CHG is an antiseptic cleaner which kills germs and bonds with the skin to continue killing germs even after washing. Please DO NOT use if you have an allergy to CHG or antibacterial soaps.  If your skin becomes reddened/irritated stop using the CHG and inform your nurse when you arrive at Short Stay. Do not shave (including legs  and underarms) for at least 48 hours prior to the first CHG shower.  You may shave your face/neck.  Please follow these instructions carefully:  1.  Shower with CHG Soap the night before surgery and the  morning of surgery.  2.  If you choose to wash your hair, wash your hair first as usual with your normal  shampoo.  3.  After you shampoo, rinse your hair and body thoroughly to remove the shampoo.                             4.  Use CHG as you would any other liquid soap.  You can apply chg directly to the skin and wash.  Gently with a scrungie or clean washcloth.  5.  Apply the CHG Soap to your body ONLY FROM THE NECK DOWN.   Do   not use on face/ open                           Wound or open sores. Avoid contact with eyes, ears mouth and  genitals (private parts).                       Wash face,  Genitals (private parts) with your normal soap.             6.  Wash thoroughly, paying special attention to the area where your    surgery  will be performed.  7.  Thoroughly rinse your body with warm water from the neck down.  8.  DO NOT shower/wash with your normal soap after using and rinsing off the CHG Soap.                9.  Pat yourself dry with a clean towel.            10.  Wear clean pajamas.            11.  Place clean sheets on your bed the night of your first shower and do not  sleep with pets. Day of Surgery : Do not apply any lotions/deodorants the morning of surgery.  Please wear clean clothes to the hospital/surgery center.  FAILURE TO FOLLOW THESE INSTRUCTIONS MAY RESULT IN THE CANCELLATION OF YOUR SURGERY  PATIENT SIGNATURE_________________________________  NURSE SIGNATURE__________________________________  ________________________________________________________________________

## 2021-04-30 ENCOUNTER — Encounter (HOSPITAL_COMMUNITY)
Admission: RE | Admit: 2021-04-30 | Discharge: 2021-04-30 | Disposition: A | Discharge status: Home / Self Care | Payer: Medicare HMO | Source: Ambulatory Visit | Attending: Surgery | Admitting: Surgery

## 2021-04-30 ENCOUNTER — Encounter (HOSPITAL_COMMUNITY): Payer: Self-pay

## 2021-04-30 DIAGNOSIS — I10 Essential (primary) hypertension: Secondary | ICD-10-CM | POA: Insufficient documentation

## 2021-04-30 DIAGNOSIS — Z87891 Personal history of nicotine dependence: Secondary | ICD-10-CM | POA: Insufficient documentation

## 2021-04-30 DIAGNOSIS — Z01812 Encounter for preprocedural laboratory examination: Secondary | ICD-10-CM | POA: Insufficient documentation

## 2021-04-30 DIAGNOSIS — Z79899 Other long term (current) drug therapy: Secondary | ICD-10-CM | POA: Diagnosis not present

## 2021-04-30 DIAGNOSIS — K219 Gastro-esophageal reflux disease without esophagitis: Secondary | ICD-10-CM | POA: Insufficient documentation

## 2021-04-30 HISTORY — DX: Malignant (primary) neoplasm, unspecified: C80.1

## 2021-04-30 HISTORY — DX: Pneumonia, unspecified organism: J18.9

## 2021-04-30 LAB — BASIC METABOLIC PANEL
Anion gap: 7 (ref 5–15)
BUN: 15 mg/dL (ref 8–23)
CO2: 32 mmol/L (ref 22–32)
Calcium: 9.2 mg/dL (ref 8.9–10.3)
Chloride: 103 mmol/L (ref 98–111)
Creatinine, Ser: 0.77 mg/dL (ref 0.61–1.24)
GFR, Estimated: >60 mL/min (ref >60)
Glucose, Bld: 74 mg/dL (ref 70–99)
Potassium: 4.2 mmol/L (ref 3.5–5.1)
Sodium: 142 mmol/L (ref 135–145)

## 2021-04-30 LAB — CBC
HCT: 46.2 % (ref 39.0–52.0)
Hemoglobin: 15.4 g/dL (ref 13.0–17.0)
MCH: 30.9 pg (ref 26.0–34.0)
MCHC: 33.3 g/dL (ref 30.0–36.0)
MCV: 92.6 fL (ref 80.0–100.0)
Platelets: 242 10*3/uL (ref 150–400)
RBC: 4.99 MIL/uL (ref 4.22–5.81)
RDW: 12.6 % (ref 11.5–15.5)
WBC: 6 10*3/uL (ref 4.0–10.5)
nRBC: 0 % (ref 0.0–0.2)

## 2021-05-07 NOTE — Progress Notes (Signed)
Anesthesia Chart Review   Case: [id number] Date/Time: 05/15/21 0715   Procedure: ROBOTIC NISSEN FUNDOPLICATION   Anesthesia type: General   Pre-op diagnosis: GERD   Location: [facility] ROOM 02 / [facility]   Surgeons: Johnathan Hausen, MD       DISCUSSION:80 y.o. former smoker with h/o HTN, GERD scheduled for above procedure 05/15/2021 with Dr. Johnathan Hausen.   Pt last seen by PCP 04/24/2021. Per OV note, "Severe LPR-requiring Nissen fundoplication, low surgical risk. He will do well. Spoke with him about postop pulmonary reinflation and some of the issues about that. He will work on deep breathing postop 6-week reevaluation, about 3 weeks postop to be sure he is sleeping in bed, looking at his breathing, repeated chest x-ray looking for degree of atelectasis of the left lower lung postop"  Anticipate pt can proceed with planned procedure barring acute status change.   VS: BP (!) 144/84   Pulse 82   Temp 36.6 C (Oral)   Resp 16   Ht 5\' 10"  (1.778 m)   Wt 89.4 kg   SpO2 98%   BMI 28.27 kg/m   PROVIDERS: Rusty Aus, MD is PCP    LABS: Labs reviewed: Acceptable for surgery. (all labs ordered are listed, but only abnormal results are displayed)  Labs Reviewed  BASIC METABOLIC PANEL  CBC     IMAGES:   EKG:   CV: Echo 02/21/2021 INTERPRETATION  NORMAL LEFT VENTRICULAR SYSTOLIC FUNCTION  NORMAL RIGHT VENTRICULAR SYSTOLIC FUNCTION  MILD VALVULAR REGURGITATION (See above)  NO VALVULAR STENOSIS  Past Medical History:  Diagnosis Date   Cancer ([place])    skin cancer   Elevated lipids    GERD (gastroesophageal reflux disease)    Hypertension    Pneumonia     Past Surgical History:  Procedure Laterality Date   BICEPT TENODESIS  12/17/2016   Procedure: BICEPS TENODESIS;  Surgeon: Corky Mull, MD;  Location: [facility] ORS;  Service: Orthopedics;;   COLONOSCOPY     COLONOSCOPY WITH PROPOFOL N/A 07/29/2016   Procedure: COLONOSCOPY WITH PROPOFOL;  Surgeon: Lollie Sails, MD;   Location: [facility] ENDOSCOPY;  Service: Endoscopy;  Laterality: N/A;   prostrate     patient denies only had a biopsy   SHOULDER ARTHROSCOPY WITH OPEN ROTATOR CUFF REPAIR Right 12/17/2016   Procedure: SHOULDER ARTHROSCOPY WITH OPEN ROTATOR CUFF REPAIR;  Surgeon: Corky Mull, MD;  Location: [facility] ORS;  Service: Orthopedics;  Laterality: Right;   SUBACROMIAL DECOMPRESSION  12/17/2016   Procedure: SUBACROMIAL DECOMPRESSION;  Surgeon: Corky Mull, MD;  Location: [facility] ORS;  Service: Orthopedics;;   TONSILLECTOMY      MEDICATIONS:  amitriptyline (ELAVIL) 10 MG tablet   doxazosin (CARDURA) 4 MG tablet   hydrochlorothiazide (HYDRODIURIL) 25 MG tablet   ibuprofen (ADVIL,MOTRIN) 200 MG tablet   metoprolol succinate (TOPROL-XL) 50 MG 24 hr tablet   oxyCODONE (ROXICODONE) 5 MG immediate release tablet   pantoprazole (PROTONIX) 40 MG tablet   simvastatin (ZOCOR) 40 MG tablet   tetrahydrozoline 0.05 % ophthalmic solution   No current facility-administered medications for this encounter.   Konrad Felix Ward, PA-C [facility] [phone]

## 2021-05-10 ENCOUNTER — Other Ambulatory Visit: Payer: Self-pay | Admitting: Surgery

## 2021-05-11 LAB — SARS CORONAVIRUS 2 (TAT 6-24 HRS): SARS Coronavirus 2: NEGATIVE

## 2021-05-13 NOTE — H&P (Signed)
15 May 2021  CC: chronic cough related to reflux  HPI Patrick Hood is a 80 y.o. male who was seen  as an office consultation at the request of Dr. Emily Filbert for evaluation of Chronic Cough.  UGI shows a small hiatal hernia and reflux.    This has bothered him for several months and historically it sounds related to laryngeal pharyngeal reflux. Dr. Sabra Heck diagnosed that early on and had him worked up for intrinsic lung disease which is negative. An upper GI series demonstrated reflux with some irritation of the lower esophageal sphincter. I evaluated his UGI and I think that he has a hiatal hernia althought small.  He did have good peristalsis on the upper GI but with free reflux.  He tried sleeping flat in bed but has had to get back into his recliner.    With discussing his symptoms with him and his wife they are better with him elevated at night. This has been a quality-of-life issue with coughing spasms. He does cough during the day as well. His symptoms are better on PPIs. He is retired from [employer] and he and his wife are volunteers at [facility].  I reviewed his records in epic and spent time with him drawing out the surgical procedures for reflux. I think that a Nissen fundoplication would be a good operation for him over a 56 dilator. I drew all that out as well as gave them a booklet on gastroesophageal reflux disease and surgery. He seemed understand this and want to move forward with that operation. I discussed videoscopic surgery and would do this on the robotic platform at [facility].  Review of Systems: See HPI as well for other ROS.  Review of Systems  Respiratory: Positive for cough.  Gastrointestinal: Positive for heartburn.  All other systems reviewed and are negative.   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   Patient Active Problem List  Diagnosis   LPRD (laryngopharyngeal reflux disease)   Benign essential hypertension   Hyperlipidemia, mixed   Medicare annual wellness visit, initial   Status post right rotator cuff repair   Past Surgical History:  Procedure Laterality Date   COLONOSCOPY 11/19/2005 D[facility]  internal hemorrhoids/Normal/Repeat 1yrs/DKS   COLONOSCOPY 07/29/2016  Diverticulosis/Internal hemorrhoids/Negative colon biopsy/Repeat 17yrs if desired or clinically indcated/MUS   impingement tendinopathy with near full-thickness rotator cuff tear,labral fraying and biceps tendinopathy right shoulder Right 12/17/2016  Dr.Poggi   neg prostate biopsy   TONSILLECTOMY    Allergies  Allergen Reactions   Ace Inhibitors Cough   Amlodipine Cough   Arb-Angiotensin Receptor Antagonist Cough   Current Outpatient Medications on File Prior to Visit  Medication Sig Dispense Refill   amitriptyline (ELAVIL) 10 MG tablet Take 1 tablet (10 mg total) by mouth at bedtime 90 tablet 3   doxazosin (CARDURA) 4 MG tablet Take 1 tablet (4 mg total) by mouth at bedtime 90 tablet 3   hydroCHLOROthiazide (HYDRODIURIL) 25 MG tablet Take 1 tablet (25 mg total) by mouth once daily 90 tablet 3   metoprolol succinate (TOPROL-XL) 50 MG XL tablet Take 1 tablet (50 mg total) by mouth once daily 90 tablet 3   pantoprazole (PROTONIX) 40 MG DR tablet Take 40 mg by mouth once daily   simvastatin (ZOCOR) 40 MG tablet Take 1 tablet (40 mg total) by  mouth at bedtime 90 tablet 3   No current facility-administered medications on file prior to visit.   Family History  Problem Relation Age of Onset   Stroke Mother   High blood pressure (Hypertension) Father    Social History   Tobacco Use  Smoking Status Never Smoker  Smokeless Tobacco Never Used    Social History   Socioeconomic History   Marital status: Married  Tobacco Use   Smoking status: Never Smoker    Smokeless tobacco: Never Used  Substance and Sexual Activity   Alcohol use: Not Currently  Comment: occ-wine   Drug use: No   Sexual activity: Yes   Objective:   Vitals:  BP: (!) 140/84  Pulse: 109  Weight: 91.4 kg (201 lb 6.4 oz)  Height: 175.3 cm (5\' 9" )   Body mass index is 29.74 kg/m.  Physical Exam General: Very well maintained white male no acute distress. HEENT: Unremarkable. Chest: Clear Heart: Sinus rhythm Breast: Not examined Abdomen: Nontender and no prior surgical incisions GU not examined Rectal not performed Extremities full range of motion and no history of DVT Neuro alert and oriented x3. Motor and sensory function are grossly intact  Labs, Imaging and Diagnostic Testing: Upper GI series at [facility].  Assessment and Plan:  Diagnoses and all orders for this visit:  Laryngopharyngeal reflux (LPR)  I agree with Dr. Sabra Heck that his reflux is likely of the laryngeal pharyngeal sort that provokes his coughing. I think that he could benefit from a foregut dissection and Nissen fundoplication. Would look for a small hiatal hernia at that time and repair it as well. We will proceed at [facility] on the robotic platform and plan to perform a hiatal hernia repair with fundoplication.    Bayard More Donia Pounds, MD

## 2021-05-14 NOTE — Anesthesia Preprocedure Evaluation (Addendum)
Anesthesia Evaluation  Patient identified by MRN, date of birth, ID band Patient awake    Reviewed: Allergy & Precautions, NPO status , Patient's Chart, lab work & pertinent test results  Airway Mallampati: II  TM Distance: >3 FB Neck ROM: Full    Dental  (+) Partial Lower   Pulmonary former smoker,    Pulmonary exam normal breath sounds clear to auscultation       Cardiovascular Exercise Tolerance: Good hypertension, Pt. on home beta blockers and Pt. on medications Normal cardiovascular exam Rhythm:Regular Rate:Normal  05/15/21: Sinus rhythm with occasional Premature ventricular complexes Otherwise normal ECG   Neuro/Psych negative neurological ROS  negative psych ROS   GI/Hepatic Neg liver ROS, GERD  Medicated,  Endo/Other  negative endocrine ROShyperlipidemia  Renal/GU negative Renal ROS  negative genitourinary   Musculoskeletal negative musculoskeletal ROS (+)   Abdominal   Peds negative pediatric ROS (+)  Hematology negative hematology ROS (+)   Anesthesia Other Findings   Reproductive/Obstetrics negative OB ROS                           Anesthesia Physical Anesthesia Plan  ASA: 2  Anesthesia Plan:    Post-op Pain Management:    Induction: Rapid sequence and Intravenous  PONV Risk Score and Plan: 1 and Treatment may vary due to age or medical condition, Ondansetron and Dexamethasone  Airway Management Planned: Oral ETT  Additional Equipment: None  Intra-op Plan:   Post-operative Plan: Extubation in OR  Informed Consent: I have reviewed the patients History and Physical, chart, labs and discussed the procedure including the risks, benefits and alternatives for the proposed anesthesia with the patient or authorized representative who has indicated his/her understanding and acceptance.     Dental advisory given  Plan Discussed with: CRNA, Anesthesiologist and  Surgeon  Anesthesia Plan Comments:        Anesthesia Quick Evaluation

## 2021-05-15 ENCOUNTER — Ambulatory Visit (HOSPITAL_COMMUNITY): Payer: Medicare HMO | Admitting: Anesthesiology

## 2021-05-15 ENCOUNTER — Ambulatory Visit (HOSPITAL_COMMUNITY): Payer: Medicare HMO | Admitting: Physician Assistant

## 2021-05-15 ENCOUNTER — Observation Stay (HOSPITAL_COMMUNITY)
Admission: RE | Admit: 2021-05-15 | Discharge: 2021-05-16 | Disposition: A | Discharge status: Home / Self Care | Payer: Medicare HMO | Attending: Surgery | Admitting: Surgery

## 2021-05-15 ENCOUNTER — Encounter (HOSPITAL_COMMUNITY): Payer: Self-pay | Admitting: Surgery

## 2021-05-15 ENCOUNTER — Encounter (HOSPITAL_COMMUNITY)
Admission: RE | Disposition: A | Discharge status: Home / Self Care | Payer: Self-pay | Source: Home / Self Care | Attending: Surgery

## 2021-05-15 ENCOUNTER — Other Ambulatory Visit: Payer: Self-pay

## 2021-05-15 DIAGNOSIS — Z79899 Other long term (current) drug therapy: Secondary | ICD-10-CM | POA: Insufficient documentation

## 2021-05-15 DIAGNOSIS — Z85828 Personal history of other malignant neoplasm of skin: Secondary | ICD-10-CM | POA: Diagnosis not present

## 2021-05-15 DIAGNOSIS — K449 Diaphragmatic hernia without obstruction or gangrene: Secondary | ICD-10-CM | POA: Diagnosis not present

## 2021-05-15 DIAGNOSIS — K219 Gastro-esophageal reflux disease without esophagitis: Secondary | ICD-10-CM | POA: Diagnosis not present

## 2021-05-15 DIAGNOSIS — Z9889 Other specified postprocedural states: Secondary | ICD-10-CM

## 2021-05-15 DIAGNOSIS — Z8701 Personal history of pneumonia (recurrent): Secondary | ICD-10-CM | POA: Diagnosis not present

## 2021-05-15 DIAGNOSIS — I10 Essential (primary) hypertension: Secondary | ICD-10-CM | POA: Insufficient documentation

## 2021-05-15 HISTORY — PX: XI ROBOTIC ASSISTED HIATAL HERNIA REPAIR: SHX6889

## 2021-05-15 LAB — CBC
HCT: 44.9 % (ref 39.0–52.0)
Hemoglobin: 15.1 g/dL (ref 13.0–17.0)
MCH: 31.3 pg (ref 26.0–34.0)
MCHC: 33.6 g/dL (ref 30.0–36.0)
MCV: 93.2 fL (ref 80.0–100.0)
Platelets: 230 10*3/uL (ref 150–400)
RBC: 4.82 MIL/uL (ref 4.22–5.81)
RDW: 12.7 % (ref 11.5–15.5)
WBC: 10.4 10*3/uL (ref 4.0–10.5)
nRBC: 0 % (ref 0.0–0.2)

## 2021-05-15 LAB — CREATININE, SERUM
Creatinine, Ser: 0.91 mg/dL (ref 0.61–1.24)
GFR, Estimated: >60 mL/min (ref >60)

## 2021-05-15 SURGERY — REPAIR, HERNIA, HIATAL, ROBOT-ASSISTED
Anesthesia: General | Site: Abdomen

## 2021-05-15 MED ORDER — CHLORHEXIDINE GLUCONATE CLOTH 2 % EX PADS: 6.0000 | MEDICATED_PAD | Freq: Once | CUTANEOUS | Status: DC

## 2021-05-15 MED ORDER — HYDROCODONE-ACETAMINOPHEN 5-325 MG PO TABS
1.0000 | ORAL_TABLET | ORAL | Status: DC | PRN
Start: 2021-05-15 — End: 2021-05-16

## 2021-05-15 MED ORDER — ESMOLOL HCL 100 MG/10ML IV SOLN
INTRAVENOUS | Status: AC
  Filled 2021-05-15: qty 10

## 2021-05-15 MED ORDER — KCL IN DEXTROSE-NACL 20-5-0.45 MEQ/L-%-% IV SOLN
INTRAVENOUS | Status: DC
  Filled 2021-05-15: qty 1000

## 2021-05-15 MED ORDER — ROCURONIUM BROMIDE 10 MG/ML (PF) SYRINGE
PREFILLED_SYRINGE | INTRAVENOUS | Status: AC
  Filled 2021-05-15: qty 10

## 2021-05-15 MED ORDER — CEFAZOLIN SODIUM-DEXTROSE 2-4 GM/100ML-% IV SOLN
2.0000 g | INTRAVENOUS | Status: AC
  Administered 2021-05-15: 2 g via INTRAVENOUS
  Filled 2021-05-15: qty 100

## 2021-05-15 MED ORDER — OXYCODONE HCL 5 MG PO TABS: 5.0000 mg | ORAL_TABLET | Freq: Once | ORAL | Status: DC | PRN

## 2021-05-15 MED ORDER — ACETAMINOPHEN 500 MG PO TABS
1000.0000 mg | ORAL_TABLET | ORAL | Status: AC
  Administered 2021-05-15: 1000 mg via ORAL
  Filled 2021-05-15: qty 2

## 2021-05-15 MED ORDER — HEPARIN SODIUM (PORCINE) 5000 UNIT/ML IJ SOLN
5000.0000 [IU] | Freq: Three times a day (TID) | INTRAMUSCULAR | Status: DC
  Administered 2021-05-15 – 2021-05-16 (×3): 5000 [IU] via SUBCUTANEOUS
  Filled 2021-05-15 (×3): qty 1

## 2021-05-15 MED ORDER — GLYCOPYRROLATE 0.2 MG/ML IJ SOLN
INTRAMUSCULAR | Status: AC
  Filled 2021-05-15: qty 3

## 2021-05-15 MED ORDER — ONDANSETRON HCL 4 MG/2ML IJ SOLN
INTRAMUSCULAR | Status: AC
  Filled 2021-05-15: qty 2

## 2021-05-15 MED ORDER — DEXAMETHASONE SODIUM PHOSPHATE 10 MG/ML IJ SOLN
INTRAMUSCULAR | Status: DC | PRN
  Administered 2021-05-15: 8 mg via INTRAVENOUS

## 2021-05-15 MED ORDER — SODIUM CHLORIDE (PF) 0.9 % IJ SOLN
INTRAMUSCULAR | Status: DC | PRN
  Administered 2021-05-15: 10 mL

## 2021-05-15 MED ORDER — BUPIVACAINE LIPOSOME 1.3 % IJ SUSP
INTRAMUSCULAR | Status: DC | PRN
  Administered 2021-05-15: 20 mL

## 2021-05-15 MED ORDER — ORAL CARE MOUTH RINSE: 15.0000 mL | Freq: Once | OROMUCOSAL | Status: AC

## 2021-05-15 MED ORDER — PROPOFOL 10 MG/ML IV BOLUS
INTRAVENOUS | Status: AC
  Filled 2021-05-15: qty 20

## 2021-05-15 MED ORDER — SUCCINYLCHOLINE CHLORIDE 200 MG/10ML IV SOSY
PREFILLED_SYRINGE | INTRAVENOUS | Status: DC | PRN
  Administered 2021-05-15: 140 mg via INTRAVENOUS

## 2021-05-15 MED ORDER — SUCCINYLCHOLINE CHLORIDE 200 MG/10ML IV SOSY
PREFILLED_SYRINGE | INTRAVENOUS | Status: AC
  Filled 2021-05-15: qty 10

## 2021-05-15 MED ORDER — FENTANYL CITRATE PF 50 MCG/ML IJ SOSY: 25.0000 ug | PREFILLED_SYRINGE | INTRAMUSCULAR | Status: DC | PRN

## 2021-05-15 MED ORDER — PHENYLEPHRINE HCL (PRESSORS) 10 MG/ML IV SOLN
INTRAVENOUS | Status: AC
  Filled 2021-05-15: qty 2

## 2021-05-15 MED ORDER — LIDOCAINE HCL (PF) 2 % IJ SOLN
INTRAMUSCULAR | Status: AC
  Filled 2021-05-15: qty 25

## 2021-05-15 MED ORDER — HEPARIN SODIUM (PORCINE) 5000 UNIT/ML IJ SOLN
5000.0000 [IU] | Freq: Once | INTRAMUSCULAR | Status: AC
  Administered 2021-05-15: 5000 [IU] via SUBCUTANEOUS
  Filled 2021-05-15: qty 1

## 2021-05-15 MED ORDER — ROCURONIUM BROMIDE 10 MG/ML (PF) SYRINGE
PREFILLED_SYRINGE | INTRAVENOUS | Status: DC | PRN
  Administered 2021-05-15: 20 mg via INTRAVENOUS
  Administered 2021-05-15: 100 mg via INTRAVENOUS

## 2021-05-15 MED ORDER — LACTATED RINGERS IR SOLN
Status: DC | PRN
  Administered 2021-05-15: 1000 mL

## 2021-05-15 MED ORDER — DIPHENHYDRAMINE HCL 50 MG/ML IJ SOLN: 12.5000 mg | Freq: Four times a day (QID) | INTRAMUSCULAR | Status: DC | PRN

## 2021-05-15 MED ORDER — NAPHAZOLINE-GLYCERIN 0.012-0.2 % OP SOLN: 1.0000 [drp] | Freq: Four times a day (QID) | OPHTHALMIC | Status: DC | PRN

## 2021-05-15 MED ORDER — DEXAMETHASONE SODIUM PHOSPHATE 10 MG/ML IJ SOLN
INTRAMUSCULAR | Status: AC
  Filled 2021-05-15: qty 1

## 2021-05-15 MED ORDER — METOPROLOL TARTRATE 5 MG/5ML IV SOLN: 5.0000 mg | Freq: Four times a day (QID) | INTRAVENOUS | Status: DC | PRN

## 2021-05-15 MED ORDER — ONDANSETRON HCL 4 MG/2ML IJ SOLN: 4.0000 mg | Freq: Four times a day (QID) | INTRAMUSCULAR | Status: DC | PRN

## 2021-05-15 MED ORDER — 0.9 % SODIUM CHLORIDE (POUR BTL) OPTIME
TOPICAL | Status: DC | PRN
  Administered 2021-05-15: 1000 mL

## 2021-05-15 MED ORDER — PROCHLORPERAZINE MALEATE 10 MG PO TABS
10.0000 mg | ORAL_TABLET | Freq: Four times a day (QID) | ORAL | Status: DC | PRN
  Filled 2021-05-15: qty 1

## 2021-05-15 MED ORDER — LACTATED RINGERS IV SOLN: INTRAVENOUS | Status: DC

## 2021-05-15 MED ORDER — CHLORHEXIDINE GLUCONATE 0.12 % MT SOLN
15.0000 mL | Freq: Once | OROMUCOSAL | Status: AC
Start: 2021-05-15 — End: 2021-05-15
  Administered 2021-05-15: 15 mL via OROMUCOSAL

## 2021-05-15 MED ORDER — METOPROLOL SUCCINATE ER 50 MG PO TB24
50.0000 mg | ORAL_TABLET | Freq: Every morning | ORAL | Status: DC
  Administered 2021-05-16: 50 mg via ORAL
  Filled 2021-05-15: qty 1

## 2021-05-15 MED ORDER — BUPIVACAINE LIPOSOME 1.3 % IJ SUSP
INTRAMUSCULAR | Status: AC
  Filled 2021-05-15: qty 20

## 2021-05-15 MED ORDER — BUPIVACAINE LIPOSOME 1.3 % IJ SUSP: 20.0000 mL | Freq: Once | INTRAMUSCULAR | Status: DC

## 2021-05-15 MED ORDER — PANTOPRAZOLE SODIUM 40 MG IV SOLR
40.0000 mg | Freq: Every day | INTRAVENOUS | Status: DC
  Administered 2021-05-15: 40 mg via INTRAVENOUS
  Filled 2021-05-15: qty 40

## 2021-05-15 MED ORDER — SUGAMMADEX SODIUM 200 MG/2ML IV SOLN
INTRAVENOUS | Status: DC | PRN
  Administered 2021-05-15: 400 mg via INTRAVENOUS

## 2021-05-15 MED ORDER — PROPOFOL 10 MG/ML IV BOLUS
INTRAVENOUS | Status: DC | PRN
  Administered 2021-05-15: 150 mg via INTRAVENOUS

## 2021-05-15 MED ORDER — SUGAMMADEX SODIUM 500 MG/5ML IV SOLN
INTRAVENOUS | Status: AC
  Filled 2021-05-15: qty 10

## 2021-05-15 MED ORDER — FENTANYL CITRATE PF 50 MCG/ML IJ SOSY
12.5000 ug | PREFILLED_SYRINGE | INTRAMUSCULAR | Status: DC | PRN
Start: 2021-05-15 — End: 2021-05-16

## 2021-05-15 MED ORDER — DEXAMETHASONE SODIUM PHOSPHATE 10 MG/ML IJ SOLN
INTRAMUSCULAR | Status: AC
  Filled 2021-05-15: qty 3

## 2021-05-15 MED ORDER — ONDANSETRON 4 MG PO TBDP: 4.0000 mg | ORAL_TABLET | Freq: Four times a day (QID) | ORAL | Status: DC | PRN

## 2021-05-15 MED ORDER — PHENYLEPHRINE HCL-NACL 20-0.9 MG/250ML-% IV SOLN
INTRAVENOUS | Status: DC | PRN
  Administered 2021-05-15: 30 ug/min via INTRAVENOUS

## 2021-05-15 MED ORDER — PROMETHAZINE HCL 25 MG/ML IJ SOLN: 6.2500 mg | INTRAMUSCULAR | Status: DC | PRN

## 2021-05-15 MED ORDER — DROPERIDOL 2.5 MG/ML IJ SOLN: 0.6250 mg | Freq: Once | INTRAMUSCULAR | Status: DC | PRN

## 2021-05-15 MED ORDER — ALBUTEROL SULFATE HFA 108 (90 BASE) MCG/ACT IN AERS
INHALATION_SPRAY | RESPIRATORY_TRACT | Status: DC | PRN
  Administered 2021-05-15: 2 via RESPIRATORY_TRACT

## 2021-05-15 MED ORDER — FENTANYL CITRATE (PF) 250 MCG/5ML IJ SOLN
INTRAMUSCULAR | Status: AC
  Filled 2021-05-15: qty 5

## 2021-05-15 MED ORDER — DIPHENHYDRAMINE HCL 12.5 MG/5ML PO ELIX: 12.5000 mg | ORAL_SOLUTION | Freq: Four times a day (QID) | ORAL | Status: DC | PRN

## 2021-05-15 MED ORDER — LIDOCAINE HCL (PF) 2 % IJ SOLN
INTRAMUSCULAR | Status: DC | PRN
Start: 2021-05-15 — End: 2021-05-15
  Administered 2021-05-15: 1.5 mg/kg/h via INTRADERMAL

## 2021-05-15 MED ORDER — OXYCODONE HCL 5 MG/5ML PO SOLN: 5.0000 mg | Freq: Once | ORAL | Status: DC | PRN

## 2021-05-15 MED ORDER — LIDOCAINE 2% (20 MG/ML) 5 ML SYRINGE
INTRAMUSCULAR | Status: DC | PRN
  Administered 2021-05-15: 80 mg via INTRAVENOUS

## 2021-05-15 MED ORDER — SCOPOLAMINE 1 MG/3DAYS TD PT72
1.0000 | MEDICATED_PATCH | TRANSDERMAL | Status: DC
  Administered 2021-05-15: 1.5 mg via TRANSDERMAL
  Filled 2021-05-15: qty 1

## 2021-05-15 MED ORDER — SODIUM CHLORIDE (PF) 0.9 % IJ SOLN
INTRAMUSCULAR | Status: AC
  Filled 2021-05-15: qty 10

## 2021-05-15 MED ORDER — ARTIFICIAL TEARS OPHTHALMIC OINT
TOPICAL_OINTMENT | OPHTHALMIC | Status: AC
  Filled 2021-05-15: qty 3.5

## 2021-05-15 MED ORDER — FENTANYL CITRATE (PF) 100 MCG/2ML IJ SOLN
INTRAMUSCULAR | Status: DC | PRN
  Administered 2021-05-15: 25 ug via INTRAVENOUS
  Administered 2021-05-15: 75 ug via INTRAVENOUS
  Administered 2021-05-15: 50 ug via INTRAVENOUS

## 2021-05-15 MED ORDER — PROCHLORPERAZINE EDISYLATE 10 MG/2ML IJ SOLN: 5.0000 mg | Freq: Four times a day (QID) | INTRAMUSCULAR | Status: DC | PRN

## 2021-05-15 MED ORDER — ESMOLOL HCL 100 MG/10ML IV SOLN
INTRAVENOUS | Status: DC | PRN
  Administered 2021-05-15: 30 mg via INTRAVENOUS

## 2021-05-15 SURGICAL SUPPLY — 58 items
APPLIER CLIP 5 13 M/L LIGAMAX5 (MISCELLANEOUS)
APPLIER CLIP ROT 13.4 12 LRG (CLIP)
BLADE SURG 15 STRL LF DISP TIS (BLADE) ×1 IMPLANT
BLADE SURG 15 STRL SS (BLADE) ×1
CLIP APPLIE 5 13 M/L LIGAMAX5 (MISCELLANEOUS) IMPLANT
CLIP APPLIE ROT 13.4 12 LRG (CLIP) IMPLANT
COVER SURGICAL LIGHT HANDLE (MISCELLANEOUS) ×2 IMPLANT
COVER TIP SHEARS 8 DVNC (MISCELLANEOUS) ×1 IMPLANT
COVER TIP SHEARS 8MM DA VINCI (MISCELLANEOUS) ×1
DECANTER SPIKE VIAL GLASS SM (MISCELLANEOUS) ×2 IMPLANT
DERMABOND ADVANCED (GAUZE/BANDAGES/DRESSINGS) ×1
DERMABOND ADVANCED .7 DNX12 (GAUZE/BANDAGES/DRESSINGS) ×1 IMPLANT
DEVICE TROCAR PUNCTURE CLOSURE (ENDOMECHANICALS) IMPLANT
DRAIN PENROSE 0.5X18 (DRAIN) ×2 IMPLANT
DRAPE ARM DVNC X/XI (DISPOSABLE) ×4 IMPLANT
DRAPE COLUMN DVNC XI (DISPOSABLE) ×1 IMPLANT
DRAPE DA VINCI XI ARM (DISPOSABLE) ×4
DRAPE DA VINCI XI COLUMN (DISPOSABLE) ×1
ELECT REM PT RETURN 15FT ADLT (MISCELLANEOUS) ×2 IMPLANT
GAUZE 4X4 16PLY ~~LOC~~+RFID DBL (SPONGE) ×2 IMPLANT
GLOVE SURG ENC TEXT LTX SZ8 (GLOVE) ×4 IMPLANT
GOWN STRL REUS W/TWL XL LVL3 (GOWN DISPOSABLE) ×8 IMPLANT
GRASPER SUT TROCAR 14GX15 (MISCELLANEOUS) IMPLANT
IRRIG SUCT STRYKERFLOW 2 WTIP (MISCELLANEOUS) ×2
IRRIGATION SUCT STRKRFLW 2 WTP (MISCELLANEOUS) ×1 IMPLANT
KIT BASIN OR (CUSTOM PROCEDURE TRAY) ×2 IMPLANT
KIT TURNOVER KIT A (KITS) IMPLANT
MARKER SKIN DUAL TIP RULER LAB (MISCELLANEOUS) ×2 IMPLANT
NEEDLE HYPO 22GX1.5 SAFETY (NEEDLE) ×2 IMPLANT
OBTURATOR OPTICAL STANDARD 8MM (TROCAR) ×1
OBTURATOR OPTICAL STND 8 DVNC (TROCAR) ×1
OBTURATOR OPTICALSTD 8 DVNC (TROCAR) ×1 IMPLANT
PACK CARDIOVASCULAR III (CUSTOM PROCEDURE TRAY) ×2 IMPLANT
PAD POSITIONING PINK XL (MISCELLANEOUS) IMPLANT
SCISSORS LAP 5X45 EPIX DISP (ENDOMECHANICALS) IMPLANT
SEAL CANN UNIV 5-8 DVNC XI (MISCELLANEOUS) ×4 IMPLANT
SEAL XI 5MM-8MM UNIVERSAL (MISCELLANEOUS) ×4
SEALER VESSEL DA VINCI XI (MISCELLANEOUS) ×1
SEALER VESSEL EXT DVNC XI (MISCELLANEOUS) ×1 IMPLANT
SOL ANTI FOG 6CC (MISCELLANEOUS) ×1 IMPLANT
SOLUTION ANTI FOG 6CC (MISCELLANEOUS) ×1
SOLUTION ELECTROLUBE (MISCELLANEOUS) ×2 IMPLANT
SUT ETHIBOND 0 36 GRN (SUTURE) ×6 IMPLANT
SUT MNCRL AB 4-0 PS2 18 (SUTURE) ×4 IMPLANT
SUT VICRYL 0 UR6 27IN ABS (SUTURE) IMPLANT
SYR 10ML ECCENTRIC (SYRINGE) ×2 IMPLANT
SYR 20ML LL LF (SYRINGE) ×2 IMPLANT
TIP INNERVISION DETACH 40FR (MISCELLANEOUS) IMPLANT
TIP INNERVISION DETACH 50FR (MISCELLANEOUS) IMPLANT
TIP INNERVISION DETACH 56FR (MISCELLANEOUS) IMPLANT
TIPS INNERVISION DETACH 40FR (MISCELLANEOUS)
TOWEL OR 17X26 10 PK STRL BLUE (TOWEL DISPOSABLE) ×2 IMPLANT
TRAY FOLEY MTR SLVR 16FR STAT (SET/KITS/TRAYS/PACK) IMPLANT
TROCAR ADV FIXATION 12X100MM (TROCAR) ×2 IMPLANT
TROCAR ADV FIXATION 5X100MM (TROCAR) IMPLANT
TROCAR BLADELESS OPT 5 100 (ENDOMECHANICALS) ×2 IMPLANT
TUBING CONNECTING 10 (TUBING) IMPLANT
TUBING INSUFFLATION 10FT LAP (TUBING) ×2 IMPLANT

## 2021-05-15 NOTE — Interval H&P Note (Signed)
History and Physical Interval Note:  05/15/2021 7:20 AM  Patrick Hood  has presented today for surgery, with the diagnosis of GERD.  The various methods of treatment have been discussed with the patient and family. After consideration of risks, benefits and other options for treatment, the patient has consented to  Procedure(s): ROBOTIC NISSEN FUNDOPLICATION (N/A) as a surgical intervention.  The patient's history has been reviewed, patient examined, no change in status, stable for surgery.  I have reviewed the patient's chart and labs.  Questions were answered to the patient's satisfaction.     Pedro Earls

## 2021-05-15 NOTE — Transfer of Care (Signed)
Immediate Anesthesia Transfer of Care Note  Patient: Patrick Hood  Procedure(s) Performed: ROBOTIC NISSEN FUNDOPLICATION (Abdomen)  Patient Location: PACU  Anesthesia Type:General  Level of Consciousness: awake, alert , oriented and patient cooperative  Airway & Oxygen Therapy: Patient Spontanous Breathing and Patient connected to face mask oxygen  Post-op Assessment: Report given to RN and Post -op Vital signs reviewed and stable  Post vital signs: Reviewed and stable  Last Vitals:  Vitals Value Taken Time  BP 157/94 05/15/21 1100  Temp 36.7 C 05/15/21 1100  Pulse 96 05/15/21 1103  Resp 15 05/15/21 1103  SpO2 95 % 05/15/21 1103  Vitals shown include unvalidated device data.  Last Pain:  Vitals:   05/15/21 0613  TempSrc:   PainSc: 0-No pain         Complications: No notable events documented.

## 2021-05-15 NOTE — Op Note (Signed)
[place]  Mar 02, 1941   05/15/2021    PCP:  Rusty Aus, MD   Surgeon: Kaylyn Lim, MD, FACS  Asst:  Gurney Maxin, MD, FACS; Elonda Husky, MD  Anes:  general  Preop Dx: Laryngopharyngeal reflux; GERD Postop Dx: same  Procedure: Xi robotic repair of hiatal hernia (2 sutures posterior) upper endoscopy, Nissen fundoplication over 91 Fr lighted bougie with three suture Location Surgery: [facility] 2 Complications: None noted  EBL:   minimal cc  Drains: none  Description of Procedure:  The patient was taken to OR 2 .  After anesthesia was administered and the patient was prepped  with chloroprep  and a timeout was performed.  4 8 mm robotic trochars were placed across transversely.  Robot was docked and the camera targeted to the foregut.  Patient was noted to have a lot of adhesions along the greater curvature of the stomach with some distortion to the left.  5 mm trocar was used to place the [facility] and retract the liver.  The anatomy of the foregut was a little bit different and that he had an accessory hepatic vessel traversing the gastrohepatic membrane which we preserved.  I went above that and went down and dissected the right crus and then carried it anteriorly and then came down on to the left side.  I then took down the cardia from the spleen and took down the short gastrics and and mobilized the cardia pretty well.  I then took it up to the left crus.  I had this mobilized but found that she had he had a lot of adhesions that were likely related to reflux changes.  He has been on 2 PPIs a day.  He is still sleeping in a chair because when he lies back he has coughing episodes.  I went ahead and performed endoscopy to verify that the EG junction was in the abdomen and the dissection had been performed without injury.  I used the tip up grasper which was in arm for to come around behind the esophagus from the left to right and we passed a Penrose drain around it and  retracted to the left.  With a retractor that completed the dissection of the periesophageal region.  I approximated the right and left crura with first a figure-of-eight suture of of 0 Surgidek.  This was followed by a simple suture of 0 Surgidek more proximally.  I then went around behind the esophagus and grabbed the cardia and brought it over performed a shoeshine maneuver.  The lighted bougie was passed.  This was a 39 [place] into the stomach.  The this was used to calibrate the wrap and with it in place 3 sutures were placed approximating the anterior portion of the cardia to the wrapped stomach and when these were tied down the wrap looked good with the sutures pretty much in the midline.  The 56 bougie was removed.  I had performed a field block tap before the case actually started with the trochars in place on both right and left sides with Exparel diluted to 30 cc.  The robot was undocked and the ports were closed with 4-0 Monocryl.  The patient tolerated the procedure well and was taken to the PACU in stable condition.     Matt B. Hassell Done, [place], [facility] Surgery, [place]

## 2021-05-15 NOTE — Anesthesia Postprocedure Evaluation (Signed)
Anesthesia Post Note  Patient: Patrick Hood  Procedure(s) Performed: ROBOTIC NISSEN FUNDOPLICATION (Abdomen)     Patient location during evaluation: PACU Anesthesia Type: General Level of consciousness: sedated Pain management: pain level controlled Vital Signs Assessment: post-procedure vital signs reviewed and stable Respiratory status: spontaneous breathing and respiratory function stable Cardiovascular status: stable Postop Assessment: no apparent nausea or vomiting Anesthetic complications: no   No notable events documented.  Last Vitals:  Vitals:   05/15/21 1145 05/15/21 1200  BP: 134/88 (!) 135/91  Pulse: 96 95  Resp: 15 15  Temp:  36.7 C  SpO2: 91% 92%    Last Pain:  Vitals:   05/15/21 1200  TempSrc:   PainSc: [place]

## 2021-05-15 NOTE — Anesthesia Procedure Notes (Signed)
Procedure Name: Intubation Date/Time: 05/15/2021 7:33 AM Performed by: Cleda Daub, CRNA Pre-anesthesia Checklist: Patient identified, Emergency Drugs available, Suction available and Patient being monitored Patient Re-evaluated:Patient Re-evaluated prior to induction Oxygen Delivery Method: Circle system utilized Preoxygenation: Pre-oxygenation with 100% oxygen Induction Type: IV induction Ventilation: Mask ventilation without difficulty Laryngoscope Size: Mac and 3 Grade View: Grade I Tube type: Oral Tube size: 7.5 mm Number of attempts: 1 Airway Equipment and Method: Stylet and Oral airway Placement Confirmation: ETT inserted through vocal cords under direct vision, positive ETCO2 and breath sounds checked- equal and bilateral Secured at: 23 cm Tube secured with: Tape Dental Injury: Teeth and Oropharynx as per pre-operative assessment

## 2021-05-15 NOTE — Progress Notes (Signed)
#   16 Fr foley cath inserted to straight drain yielded 600 cc's yellow urine. Pt tol procedure well.

## 2021-05-16 ENCOUNTER — Encounter (HOSPITAL_COMMUNITY): Payer: Self-pay | Admitting: Surgery

## 2021-05-16 DIAGNOSIS — K449 Diaphragmatic hernia without obstruction or gangrene: Secondary | ICD-10-CM | POA: Diagnosis not present

## 2021-05-16 DIAGNOSIS — I10 Essential (primary) hypertension: Secondary | ICD-10-CM | POA: Diagnosis not present

## 2021-05-16 DIAGNOSIS — Z79899 Other long term (current) drug therapy: Secondary | ICD-10-CM | POA: Diagnosis not present

## 2021-05-16 DIAGNOSIS — K219 Gastro-esophageal reflux disease without esophagitis: Secondary | ICD-10-CM | POA: Diagnosis not present

## 2021-05-16 LAB — CBC
HCT: 42.3 % (ref 39.0–52.0)
Hemoglobin: 14.3 g/dL (ref 13.0–17.0)
MCH: 31.2 pg (ref 26.0–34.0)
MCHC: 33.8 g/dL (ref 30.0–36.0)
MCV: 92.4 fL (ref 80.0–100.0)
Platelets: 214 10*3/uL (ref 150–400)
RBC: 4.58 MIL/uL (ref 4.22–5.81)
RDW: 12.9 % (ref 11.5–15.5)
WBC: 14.9 10*3/uL — ABNORMAL HIGH (ref 4.0–10.5)
nRBC: 0 % (ref 0.0–0.2)

## 2021-05-16 MED ORDER — HYDROCODONE-ACETAMINOPHEN 5-325 MG PO TABS: 1.0000 | ORAL_TABLET | ORAL | 0 refills | Status: AC | PRN

## 2021-05-16 MED ORDER — ONDANSETRON 4 MG PO TBDP: 4.0000 mg | ORAL_TABLET | Freq: Four times a day (QID) | ORAL | 0 refills | Status: AC | PRN

## 2021-05-16 MED ORDER — PANTOPRAZOLE SODIUM 40 MG PO TBEC: 40.0000 mg | DELAYED_RELEASE_TABLET | Freq: Every day | ORAL | 0 refills | Status: AC

## 2021-05-16 MED ORDER — IBUPROFEN 400 MG PO TABS
600.0000 mg | ORAL_TABLET | Freq: Four times a day (QID) | ORAL | Status: DC | PRN
  Administered 2021-05-16: 600 mg via ORAL
  Filled 2021-05-16: qty 1

## 2021-05-16 NOTE — Progress Notes (Signed)
Patient ID: Patrick Hood, male   DOB: 1940-11-02, 80 y.o.   MRN: [id number] Urinary retention last evening.  Foley inserted (very painful) and 600 cc returned.   Foley out this am for voiding trial.  Scopolamine patch removed.  IV out and diet to full liquids.    Kaylyn Lim, MD, FACS

## 2021-05-16 NOTE — Care Management CC44 (Signed)
Condition Code 44 Documentation Completed  Patient Details  Name: RIOT WATERWORTH MRN: [id number] Date of Birth: February 22, 1941   Condition Code 44 given:  Yes Patient signature on Condition Code 44 notice:  Yes Documentation of 2 MD's agreement:  Yes Code 44 added to claim:  Yes    Arelene Moroni, LCSW 05/16/2021, 10:30 AM

## 2021-05-16 NOTE — Care Management Obs Status (Signed)
[place] NOTIFICATION   Patient Details  Name: Patrick Hood MRN: [id number] Date of Birth: February 26, 1941   Medicare Observation Status Notification Given:  Yes    [name], [name] 05/16/2021, 10:29 AM

## 2021-05-16 NOTE — Discharge Summary (Signed)
Physician Discharge Summary  Patient ID: SENICA CRALL MRN: [id number] DOB/AGE: [birth year] 80 y.o.  PCP: Rusty Aus, MD  Admit date: 05/15/2021 Discharge date: 05/16/2021  Admission Diagnoses:  laryngopharyngeal reflux with cough  Discharge Diagnoses:  same with hiatal hernia   Principal Problem:   Status post laparoscopic Nissen fundoplication Oct 79   Surgery:  robotic hiatal hernia repair and Nissen fundoplication over a #11 Fr bougie.  Upper endoscopy  Discharged Condition: improved  Hospital Course:   had surgery on Tuesday.  Found adhesions from the greater curvature to the left lateral abdominal wall (no prior surgery) .  Hiatal hernia with evidence of GERD.  Posterior hiatal hernia repair performed with Nissen fundoplication over a 12 Fr dilator.  Tolerating liquids.  He did have urinary retention postop that responded to a Foley overnight.  Ready for discharge on Wednesday taking full liquids for a week then a pureed diet.    Consults: none  Significant Diagnostic Studies: none    Discharge Exam: Blood pressure 121/83, pulse 81, temperature 97.7 F (36.5 C), temperature source Oral, resp. rate 18, height 5\' 10"  (1.778 m), weight 89.4 kg, SpO2 93 %. Incisions covered with Dermabond  Disposition: Discharge disposition: 01-Home or Self Care       Discharge Instructions     Call MD for:   Complete by: As directed    It is ok to call or text me on [place]   Call MD for:  persistant nausea and vomiting   Complete by: As directed    Call MD for:  redness, tenderness, or signs of infection (pain, swelling, redness, odor or green/yellow discharge around incision site)   Complete by: As directed    Diet full liquid   Complete by: As directed    Full liquids (soups, Ensure or other protein shakes) for 1 week then pureed foods for ~ 3 weeks.   Discharge wound care:   Complete by: As directed    You may shower and shampoo when you get home    Increase activity slowly   Complete by: As directed       Allergies as of 05/16/2021       Reactions   Ace Inhibitors Cough   Amlodipine Cough        Medication List     STOP taking these medications    oxyCODONE 5 MG immediate release tablet Commonly known as: Roxicodone       TAKE these medications    amitriptyline 10 MG tablet Commonly known as: ELAVIL Take 10 mg by mouth at bedtime.   doxazosin 4 MG tablet Commonly known as: CARDURA Take 4 mg by mouth every evening.   hydrochlorothiazide 25 MG tablet Commonly known as: HYDRODIURIL Take 12.5 mg by mouth daily.   HYDROcodone-acetaminophen 5-325 MG tablet Commonly known as: NORCO/VICODIN Take 1-2 tablets by mouth every 4 (four) hours as needed for moderate pain.   ibuprofen 200 MG tablet Commonly known as: ADVIL Take 200 mg by mouth every 8 (eight) hours as needed for moderate pain.   metoprolol succinate 50 MG 24 hr tablet Commonly known as: TOPROL-XL Take 50 mg by mouth in the morning.   ondansetron 4 MG disintegrating tablet Commonly known as: ZOFRAN-ODT Take 1 tablet (4 mg total) by mouth every 6 (six) hours as needed for nausea.   pantoprazole 40 MG tablet Commonly known as: PROTONIX Take 1 tablet (40 mg total) by mouth daily. What changed: when to take this  simvastatin 40 MG tablet Commonly known as: ZOCOR Take 40 mg by mouth every evening.   tetrahydrozoline 0.05 % ophthalmic solution Place 2 drops into both eyes daily as needed (dry eyes).               Discharge Care Instructions  (From admission, onward)           Start     Ordered   05/16/21 0000  Discharge wound care:       Comments: You may shower and shampoo when you get home   05/16/21 1214            Follow-up Information     Johnathan Hausen, MD. Schedule an appointment as soon as possible for a visit in 3 week(s).   Specialty: General Surgery Why: For routine follow up with Dr. Carter Kitten  information: [address] [place] [address] [phone]                 Signed: Pedro Earls 05/16/2021, 3:52 PM

## 2021-05-16 NOTE — Progress Notes (Signed)
   05/16/21 1300  Mobility  Activity Ambulated in hall  Level of Assistance Independent  Assistive Device None  Distance Ambulated (ft) 1200 ft  Mobility Ambulated independently in hallway  Mobility Response Tolerated well  Mobility performed by Mobility specialist  $Mobility charge 1 Mobility   Pt agreeable to mobilize this morning. Ambulated about 1236ft with no device in the hall, tolerated well. He stated that he was experiencing some discomfort in his upper back/chest and shoulders. Otherwise, no complaints. Left pt in room with wife. Pt very excited to go home. NT notified of session.  [place] Specialist Acute Rehab Services Office: [phone]

## 2021-05-16 NOTE — Progress Notes (Signed)
Discharge instructions given to patient and all questions were answered.  

## 2021-06-07 DIAGNOSIS — Z9889 Other specified postprocedural states: Secondary | ICD-10-CM | POA: Diagnosis not present

## 2021-08-30 DIAGNOSIS — J208 Acute bronchitis due to other specified organisms: Secondary | ICD-10-CM | POA: Diagnosis not present

## 2021-08-30 DIAGNOSIS — U071 COVID-19: Secondary | ICD-10-CM | POA: Diagnosis not present

## 2021-08-30 DIAGNOSIS — I10 Essential (primary) hypertension: Secondary | ICD-10-CM | POA: Diagnosis not present

## 2021-08-30 DIAGNOSIS — J45909 Unspecified asthma, uncomplicated: Secondary | ICD-10-CM | POA: Diagnosis not present

## 2021-09-05 DIAGNOSIS — L570 Actinic keratosis: Secondary | ICD-10-CM | POA: Diagnosis not present

## 2021-09-05 DIAGNOSIS — Z85820 Personal history of malignant melanoma of skin: Secondary | ICD-10-CM | POA: Diagnosis not present

## 2021-09-05 DIAGNOSIS — Z08 Encounter for follow-up examination after completed treatment for malignant neoplasm: Secondary | ICD-10-CM | POA: Diagnosis not present

## 2021-09-05 DIAGNOSIS — D2239 Melanocytic nevi of other parts of face: Secondary | ICD-10-CM | POA: Diagnosis not present

## 2021-09-05 DIAGNOSIS — L820 Inflamed seborrheic keratosis: Secondary | ICD-10-CM | POA: Diagnosis not present

## 2021-10-24 DIAGNOSIS — J45909 Unspecified asthma, uncomplicated: Secondary | ICD-10-CM | POA: Diagnosis not present

## 2022-01-09 DIAGNOSIS — D485 Neoplasm of uncertain behavior of skin: Secondary | ICD-10-CM | POA: Diagnosis not present

## 2022-01-09 DIAGNOSIS — L820 Inflamed seborrheic keratosis: Secondary | ICD-10-CM | POA: Diagnosis not present

## 2022-01-10 DIAGNOSIS — H524 Presbyopia: Secondary | ICD-10-CM | POA: Diagnosis not present

## 2022-01-10 DIAGNOSIS — Z0100 Encounter for examination of eyes and vision without abnormal findings: Secondary | ICD-10-CM | POA: Diagnosis not present

## 2022-01-29 DIAGNOSIS — K219 Gastro-esophageal reflux disease without esophagitis: Secondary | ICD-10-CM | POA: Diagnosis not present

## 2022-01-29 DIAGNOSIS — R059 Cough, unspecified: Secondary | ICD-10-CM | POA: Diagnosis not present

## 2022-02-20 DIAGNOSIS — Z125 Encounter for screening for malignant neoplasm of prostate: Secondary | ICD-10-CM | POA: Diagnosis not present

## 2022-02-20 DIAGNOSIS — E782 Mixed hyperlipidemia: Secondary | ICD-10-CM | POA: Diagnosis not present

## 2022-02-20 DIAGNOSIS — E538 Deficiency of other specified B group vitamins: Secondary | ICD-10-CM | POA: Diagnosis not present

## 2022-02-27 DIAGNOSIS — Z1389 Encounter for screening for other disorder: Secondary | ICD-10-CM | POA: Diagnosis not present

## 2022-02-27 DIAGNOSIS — E538 Deficiency of other specified B group vitamins: Secondary | ICD-10-CM | POA: Diagnosis not present

## 2022-02-27 DIAGNOSIS — Z0000 Encounter for general adult medical examination without abnormal findings: Secondary | ICD-10-CM | POA: Diagnosis not present

## 2022-02-27 DIAGNOSIS — E785 Hyperlipidemia, unspecified: Secondary | ICD-10-CM | POA: Diagnosis not present

## 2022-02-27 DIAGNOSIS — K219 Gastro-esophageal reflux disease without esophagitis: Secondary | ICD-10-CM | POA: Diagnosis not present

## 2022-05-23 DIAGNOSIS — Z9889 Other specified postprocedural states: Secondary | ICD-10-CM | POA: Diagnosis not present

## 2022-05-28 ENCOUNTER — Other Ambulatory Visit: Payer: Self-pay | Admitting: Surgery

## 2022-05-28 DIAGNOSIS — Z9889 Other specified postprocedural states: Secondary | ICD-10-CM

## 2022-06-06 ENCOUNTER — Ambulatory Visit
Admission: RE | Admit: 2022-06-06 | Discharge: 2022-06-06 | Disposition: A | Discharge status: Home / Self Care | Payer: Medicare HMO | Source: Ambulatory Visit | Attending: Surgery | Admitting: Surgery

## 2022-06-06 ENCOUNTER — Other Ambulatory Visit: Payer: Medicare HMO

## 2022-06-06 DIAGNOSIS — K5730 Diverticulosis of large intestine without perforation or abscess without bleeding: Secondary | ICD-10-CM | POA: Diagnosis not present

## 2022-06-06 DIAGNOSIS — Z9889 Other specified postprocedural states: Secondary | ICD-10-CM

## 2022-06-18 ENCOUNTER — Other Ambulatory Visit: Payer: Medicare HMO

## 2022-07-03 DIAGNOSIS — K224 Dyskinesia of esophagus: Secondary | ICD-10-CM | POA: Diagnosis not present

## 2022-07-03 DIAGNOSIS — Z9889 Other specified postprocedural states: Secondary | ICD-10-CM | POA: Diagnosis not present

## 2022-09-05 DIAGNOSIS — Z125 Encounter for screening for malignant neoplasm of prostate: Secondary | ICD-10-CM | POA: Diagnosis not present

## 2022-09-05 DIAGNOSIS — D510 Vitamin B12 deficiency anemia due to intrinsic factor deficiency: Secondary | ICD-10-CM | POA: Diagnosis not present

## 2022-09-05 DIAGNOSIS — E785 Hyperlipidemia, unspecified: Secondary | ICD-10-CM | POA: Diagnosis not present

## 2022-09-05 DIAGNOSIS — R739 Hyperglycemia, unspecified: Secondary | ICD-10-CM | POA: Diagnosis not present

## 2022-09-05 DIAGNOSIS — I10 Essential (primary) hypertension: Secondary | ICD-10-CM | POA: Diagnosis not present

## 2022-09-11 DIAGNOSIS — L570 Actinic keratosis: Secondary | ICD-10-CM | POA: Diagnosis not present

## 2022-09-11 DIAGNOSIS — Z08 Encounter for follow-up examination after completed treatment for malignant neoplasm: Secondary | ICD-10-CM | POA: Diagnosis not present

## 2022-09-11 DIAGNOSIS — D2272 Melanocytic nevi of left lower limb, including hip: Secondary | ICD-10-CM | POA: Diagnosis not present

## 2022-09-11 DIAGNOSIS — D2262 Melanocytic nevi of left upper limb, including shoulder: Secondary | ICD-10-CM | POA: Diagnosis not present

## 2022-09-11 DIAGNOSIS — Z85820 Personal history of malignant melanoma of skin: Secondary | ICD-10-CM | POA: Diagnosis not present

## 2022-09-11 DIAGNOSIS — L821 Other seborrheic keratosis: Secondary | ICD-10-CM | POA: Diagnosis not present

## 2022-09-11 DIAGNOSIS — D225 Melanocytic nevi of trunk: Secondary | ICD-10-CM | POA: Diagnosis not present

## 2022-09-11 DIAGNOSIS — D2271 Melanocytic nevi of right lower limb, including hip: Secondary | ICD-10-CM | POA: Diagnosis not present

## 2022-09-11 DIAGNOSIS — D2261 Melanocytic nevi of right upper limb, including shoulder: Secondary | ICD-10-CM | POA: Diagnosis not present

## 2022-12-16 DIAGNOSIS — J40 Bronchitis, not specified as acute or chronic: Secondary | ICD-10-CM | POA: Diagnosis not present

## 2022-12-16 DIAGNOSIS — R051 Acute cough: Secondary | ICD-10-CM | POA: Diagnosis not present

## 2023-01-14 DIAGNOSIS — H524 Presbyopia: Secondary | ICD-10-CM | POA: Diagnosis not present

## 2023-03-04 DIAGNOSIS — Z125 Encounter for screening for malignant neoplasm of prostate: Secondary | ICD-10-CM | POA: Diagnosis not present

## 2023-03-04 DIAGNOSIS — R739 Hyperglycemia, unspecified: Secondary | ICD-10-CM | POA: Diagnosis not present

## 2023-03-04 DIAGNOSIS — E782 Mixed hyperlipidemia: Secondary | ICD-10-CM | POA: Diagnosis not present

## 2023-03-04 DIAGNOSIS — D510 Vitamin B12 deficiency anemia due to intrinsic factor deficiency: Secondary | ICD-10-CM | POA: Diagnosis not present

## 2023-03-10 DIAGNOSIS — R972 Elevated prostate specific antigen [PSA]: Secondary | ICD-10-CM | POA: Diagnosis not present

## 2023-03-10 DIAGNOSIS — Z0000 Encounter for general adult medical examination without abnormal findings: Secondary | ICD-10-CM | POA: Diagnosis not present

## 2023-03-10 DIAGNOSIS — F03A0 Unspecified dementia, mild, without behavioral disturbance, psychotic disturbance, mood disturbance, and anxiety: Secondary | ICD-10-CM | POA: Diagnosis not present

## 2023-03-10 DIAGNOSIS — E785 Hyperlipidemia, unspecified: Secondary | ICD-10-CM | POA: Diagnosis not present

## 2023-03-10 DIAGNOSIS — Z1331 Encounter for screening for depression: Secondary | ICD-10-CM | POA: Diagnosis not present

## 2023-03-10 NOTE — Progress Notes (Signed)
 Medicare Wellness Visit   Providers Rendering Care Dr. Oneil Miller-Internal Medicine  Functional Assessment (1) Hearing: Demonstrates no difficulty in hearing during normal conversation (2) Risk of Falls: Patient denies any falls or near falls in the last year (3) Home Safety: Patient feels secure in their home. There are operational smoke alarms in multiple areas of the home. (4) Activities of Daily Living: Independently manages personal grooming and household chores, including cooking, cleaning and laundry.  Manages Personal finances without assistance.    Depression Screening PHQ 2/9 last 3 flowsheet values     02/12/2021    8:41 AM 02/27/2022    9:22 AM 03/10/2023    3:11 PM  PHQ-2/9 Depression Screening   Little interest or pleasure in doing things   0  Feeling down, depressed, or hopeless   0  Patient Health Questionnaire-2 Score   0  (OBSOLETE) Little interest or pleasure in doing things 0 0   (OBSOLETE) Feeling down, depressed, or hopeless (or irritable for Teens only)? 1 0   (OBSOLETE) Total Prescreening Score 1 0   (OBSOLETE) Total Score = 1 0      Depression Severity and Treatment Recommendations:  0-4= None  5-9= Mild / Treatment: Support, educate to call if worse; return in one month  10-14= Moderate / Treatment: Support, watchful waiting; Antidepressant or Psychotherapy  15-19= Moderately severe / Treatment: Antidepressant OR Psychotherapy  >= 20 = Major depression, severe / Antidepressant AND Psychotherapy   Cognitive impairment Oriented to person, place and time.  Responses appear appropriate and timely to this observer.   Prevention Plan  Item name                              Frequency        Month Due       Year Due Health Maintenance  Topic Date Due  . COVID-19 Vaccine (5 - 2023-24 season) 03/22/2022  . Influenza Vaccine (1) 03/23/2023  . Creatinine Level  03/03/2024  . Potassium Level  03/03/2024  . Lipid Panel  03/03/2024  . Serum Calcium   03/03/2024  . Depression Screening  03/09/2024  . Medicare Initial or AWV  03/09/2024  . Diabetes Screening  03/03/2026  . Colonoscopy  07/29/2026  . Adult Tetanus (Td And Tdap)  02/04/2029  . Pneumococcal Vaccine: 65+  Completed  . Shingrix  Completed  . Hib Vaccines  Aged Out  . Hepatitis A Vaccines  Aged Out  . Meningococcal ACWY Vaccine  Aged Out  . HPV Vaccines  Aged Out    Other personalized health advice Encouraged patient to exercise regularly.  Encouraged attention to diet with good intake of fruits, vegetables, and limitation of red meat to 2 times a week or less    End of Life Counseling Patient has a living will in place.  Patient is a full code.

## 2023-06-11 DIAGNOSIS — R972 Elevated prostate specific antigen [PSA]: Secondary | ICD-10-CM | POA: Diagnosis not present

## 2023-06-25 DIAGNOSIS — D2262 Melanocytic nevi of left upper limb, including shoulder: Secondary | ICD-10-CM | POA: Diagnosis not present

## 2023-06-25 DIAGNOSIS — D225 Melanocytic nevi of trunk: Secondary | ICD-10-CM | POA: Diagnosis not present

## 2023-06-25 DIAGNOSIS — R208 Other disturbances of skin sensation: Secondary | ICD-10-CM | POA: Diagnosis not present

## 2023-06-25 DIAGNOSIS — D2272 Melanocytic nevi of left lower limb, including hip: Secondary | ICD-10-CM | POA: Diagnosis not present

## 2023-06-25 DIAGNOSIS — L089 Local infection of the skin and subcutaneous tissue, unspecified: Secondary | ICD-10-CM | POA: Diagnosis not present

## 2023-06-25 DIAGNOSIS — L853 Xerosis cutis: Secondary | ICD-10-CM | POA: Diagnosis not present

## 2023-06-25 DIAGNOSIS — D485 Neoplasm of uncertain behavior of skin: Secondary | ICD-10-CM | POA: Diagnosis not present

## 2023-06-25 DIAGNOSIS — B079 Viral wart, unspecified: Secondary | ICD-10-CM | POA: Diagnosis not present

## 2023-06-25 DIAGNOSIS — D2261 Melanocytic nevi of right upper limb, including shoulder: Secondary | ICD-10-CM | POA: Diagnosis not present

## 2023-06-25 DIAGNOSIS — L538 Other specified erythematous conditions: Secondary | ICD-10-CM | POA: Diagnosis not present

## 2023-06-25 DIAGNOSIS — Z85820 Personal history of malignant melanoma of skin: Secondary | ICD-10-CM | POA: Diagnosis not present

## 2023-09-08 DIAGNOSIS — D510 Vitamin B12 deficiency anemia due to intrinsic factor deficiency: Secondary | ICD-10-CM | POA: Diagnosis not present

## 2023-09-08 DIAGNOSIS — R972 Elevated prostate specific antigen [PSA]: Secondary | ICD-10-CM | POA: Diagnosis not present

## 2023-09-08 DIAGNOSIS — E782 Mixed hyperlipidemia: Secondary | ICD-10-CM | POA: Diagnosis not present

## 2023-09-15 DIAGNOSIS — E785 Hyperlipidemia, unspecified: Secondary | ICD-10-CM | POA: Diagnosis not present

## 2023-09-15 DIAGNOSIS — F03A0 Unspecified dementia, mild, without behavioral disturbance, psychotic disturbance, mood disturbance, and anxiety: Secondary | ICD-10-CM | POA: Diagnosis not present

## 2023-09-15 DIAGNOSIS — Z125 Encounter for screening for malignant neoplasm of prostate: Secondary | ICD-10-CM | POA: Diagnosis not present

## 2023-09-15 NOTE — Progress Notes (Signed)
 Patient Profile:   Patrick Hood  is a 83 y.o.  male Chief Complaint  Patient presents with  . Follow-up      PROBLEM LIST: Past Medical History:  Diagnosis Date  . Diverticulosis 07/29/2016  . HTN (hypertension) 01/13/2014  . Hyperlipidemia 01/13/2014  . Internal hemorrhoids 07/29/2016  . LPRD (laryngopharyngeal reflux disease) 11/14/2015    Past Surgical History:  Procedure Laterality Date  . COLONOSCOPY  07/29/2016   Diverticulosis/Internal hemorrhoids/Negative colon biopsy/Repeat 43yrs if desired or clinically indcated/MUS  . 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 nissen Fundoplication  05/16/2021  . COLONOSCOPY  11/19/2005 [facility]   internal hemorrhoids/Normal/Repeat 37yrs/[facility]  . neg prostate biopsy    . TONSILLECTOMY      ALLERGIES: Allergies  Allergen Reactions  . Ace Inhibitors Cough  . Amlodipine Cough  . Arb-Angiotensin Receptor Antagonist Cough    CURRENT MEDICATIONS: Current Outpatient Medications  Medication Sig Dispense Refill  . amitriptyline (ELAVIL) 10 MG tablet Take 1 tablet (10 mg total) by mouth at bedtime 90 tablet 3  . cyanocobalamin  (VITAMIN B12) 1000 MCG tablet Take 1,000 mcg by mouth once daily 5000 mcg week    . donepeziL (ARICEPT) 5 MG tablet TAKE 1 TABLET AT BEDTIME 90 tablet 3  . doxazosin (CARDURA) 4 MG tablet Take 1 tablet (4 mg total) by mouth at bedtime 90 tablet 3  . hydroCHLOROthiazide (HYDRODIURIL) 25 MG tablet Take 1 tablet (25 mg total) by mouth once daily 90 tablet 3  . metoclopramide (REGLAN) 5 MG tablet Take 1 tablet (5 mg total) by mouth at bedtime 90 tablet 3  . metoprolol  SUCCinate (TOPROL -XL) 50 MG XL tablet Take 1 tablet (50 mg total) by mouth once daily 90 tablet 3  . pantoprazole  (PROTONIX ) 40 MG DR tablet TAKE 1 TABLET EVERY MORNING AND TAKE 1 TABLET AT BEDTIME 180 tablet 3  .  simvastatin (ZOCOR) 40 MG tablet Take 1 tablet (40 mg total) by mouth at bedtime 90 tablet 3  . etodolac (LODINE) 400 MG tablet Take 1 tablet (400 mg total) by mouth 2 (two) times daily 60 tablet 5  . predniSONE (DELTASONE) 10 MG tablet Take 1 tablet (10 mg total) by mouth once daily for 10 days 10 tablet 0   No current facility-administered medications for this visit.      HPI   CLINICAL SUMMARY:  Patient reports left shoulder pain, some pain with abduction, really no pain lying on that side.  Back on B12.  Has been working on the gym, no walking per se  ROS: Review of systems is unremarkable for any active cardiac, respiratory, GI, GU, hematologic, neurologic, dermatologic, HEENT, or psychiatric symptoms except as noted above, 10 systems reviewed.  No fevers, chills, or constitutional symptoms.   PHYSICAL EXAM  Vital signs:  BP 122/60   Pulse 81   Wt 90.4 kg (199 lb 6.4 oz)   SpO2 92%   BMI 31.23 kg/m  Body mass index is 31.23 kg/m.   Wt Readings from Last 3 Encounters:  09/15/23 90.4 kg (199 lb 6.4 oz)  03/10/23 88.3 kg (194 lb 9.6 oz)  12/16/22 87.4 kg (192 lb 9.6 oz)     BP Readings from Last 3 Encounters:  09/15/23 122/60  03/10/23 128/86  12/16/22 139/80    Constitutional:NAD Neck: supple, no thyromegaly, good ROM Respiratory:clear to auscultation, no rales or wheezes Cardiovascular:RRR, no murmur or gallop Abdominal:soft, good BS, NT Ext: no edema, good peripheral pulses Neuro: alert and oriented X 3, grossly nonfocal     ASSESSMENT/PLAN   LPR-reasonably stable on Protonix , metoclopramide Hyperlipidemia-numbers good on Zocor 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

## 2023-10-06 DIAGNOSIS — K219 Gastro-esophageal reflux disease without esophagitis: Secondary | ICD-10-CM | POA: Diagnosis not present

## 2023-10-06 DIAGNOSIS — M25512 Pain in left shoulder: Secondary | ICD-10-CM | POA: Diagnosis not present

## 2023-10-06 NOTE — Progress Notes (Signed)
 Patient Profile:   Patrick Hood  is a 83 y.o.  male Chief Complaint  Patient presents with  . 3 week follow up    Left arm is 100% better.      PROBLEM LIST: Past Medical History:  Diagnosis Date  . Diverticulosis 07/29/2016  . HTN (hypertension) 01/13/2014  . Hyperlipidemia 01/13/2014  . Internal hemorrhoids 07/29/2016  . LPRD (laryngopharyngeal reflux disease) 11/14/2015    Past Surgical History:  Procedure Laterality Date  . COLONOSCOPY  07/29/2016   Diverticulosis/Internal hemorrhoids/Negative colon biopsy/Repeat 41yrs if desired or clinically indcated/MUS  . 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 nissen Fundoplication  05/16/2021  . COLONOSCOPY  11/19/2005 [facility]   internal hemorrhoids/Normal/Repeat 80yrs/[facility]  . neg prostate biopsy    . TONSILLECTOMY      ALLERGIES: Allergies  Allergen Reactions  . Ace Inhibitors Cough  . Amlodipine Cough  . Arb-Angiotensin Receptor Antagonist Cough    CURRENT MEDICATIONS: Current Outpatient Medications  Medication Sig Dispense Refill  . amitriptyline (ELAVIL) 10 MG tablet Take 1 tablet (10 mg total) by mouth at bedtime 90 tablet 3  . cyanocobalamin  (VITAMIN B12) 1000 MCG tablet Take 1,000 mcg by mouth once daily 5000 mcg week    . donepeziL (ARICEPT) 5 MG tablet TAKE 1 TABLET AT BEDTIME 90 tablet 3  . doxazosin (CARDURA) 4 MG tablet Take 1 tablet (4 mg total) by mouth at bedtime 90 tablet 3  . etodolac (LODINE) 400 MG tablet Take 1 tablet (400 mg total) by mouth 2 (two) times daily 60 tablet 5  . hydroCHLOROthiazide (HYDRODIURIL) 25 MG tablet Take 1 tablet (25 mg total) by mouth once daily 90 tablet 3  . metoclopramide (REGLAN) 5 MG tablet Take 1 tablet (5 mg total) by mouth at bedtime 90 tablet 3  . metoprolol  SUCCinate (TOPROL -XL) 50 MG XL tablet Take 1 tablet (50 mg total) by  mouth once daily 90 tablet 3  . pantoprazole  (PROTONIX ) 40 MG DR tablet TAKE 1 TABLET EVERY MORNING AND TAKE 1 TABLET AT BEDTIME 180 tablet 3  . simvastatin (ZOCOR) 40 MG tablet Take 1 tablet (40 mg total) by mouth at bedtime 90 tablet 3   No current facility-administered medications for this visit.      HPI   CLINICAL SUMMARY:  Patient overall doing fairly well.  Left shoulder is dramatically better on the prednisone/etodolac.  Blood pressure well-controlled.  LPR controlled  ROS: Review of systems is unremarkable for any active cardiac, respiratory, GI, GU, hematologic, neurologic, dermatologic, HEENT, or psychiatric symptoms except as noted above, 10 systems reviewed.  No fevers, chills, or constitutional symptoms.   PHYSICAL EXAM  Vital signs:  BP 120/80   Pulse 84   Wt 92.2 kg (203 lb 3.2 oz)   SpO2 95%   BMI 31.83 kg/m  Body mass index is 31.83 kg/m.   Wt Readings from Last 3 Encounters:  10/06/23 92.2 kg (203 lb 3.2 oz)  09/15/23 90.4 kg (199 lb 6.4 oz)  03/10/23 88.3 kg (194 lb 9.6 oz)     BP Readings from Last 3 Encounters:  10/06/23 120/80  09/15/23 122/60  03/10/23 128/86    Constitutional:NAD Neck: supple, no thyromegaly, good ROM Respiratory:clear to auscultation, no rales or wheezes Cardiovascular:RRR, no murmur  or gallop Abdominal:soft, good BS, NT Ext: no edema, good peripheral pulses Neuro: alert and oriented X 3, grossly nonfocal     ASSESSMENT/PLAN   Left shoulder pain-overall dramatically improved.  Off prednisone, wean down etodolac and then use as needed LPR-stable on Protonix  plus Reglan  Dispo:   No follow-ups on file.

## 2024-02-03 DIAGNOSIS — H5213 Myopia, bilateral: Secondary | ICD-10-CM | POA: Diagnosis not present

## 2024-03-08 DIAGNOSIS — E782 Mixed hyperlipidemia: Secondary | ICD-10-CM | POA: Diagnosis not present

## 2024-03-08 DIAGNOSIS — Z125 Encounter for screening for malignant neoplasm of prostate: Secondary | ICD-10-CM | POA: Diagnosis not present

## 2024-03-08 DIAGNOSIS — D510 Vitamin B12 deficiency anemia due to intrinsic factor deficiency: Secondary | ICD-10-CM | POA: Diagnosis not present

## 2024-03-08 DIAGNOSIS — R739 Hyperglycemia, unspecified: Secondary | ICD-10-CM | POA: Diagnosis not present

## 2024-03-13 ENCOUNTER — Emergency Department
Admission: EM | Admit: 2024-03-13 | Discharge: 2024-03-13 | Disposition: A | Discharge status: Home / Self Care | Attending: Emergency Medicine | Admitting: Emergency Medicine

## 2024-03-13 ENCOUNTER — Emergency Department

## 2024-03-13 ENCOUNTER — Other Ambulatory Visit: Payer: Self-pay

## 2024-03-13 ENCOUNTER — Encounter: Payer: Self-pay | Admitting: *Deleted

## 2024-03-13 DIAGNOSIS — I10 Essential (primary) hypertension: Secondary | ICD-10-CM | POA: Insufficient documentation

## 2024-03-13 DIAGNOSIS — R0781 Pleurodynia: Secondary | ICD-10-CM | POA: Diagnosis not present

## 2024-03-13 DIAGNOSIS — S20212A Contusion of left front wall of thorax, initial encounter: Secondary | ICD-10-CM | POA: Diagnosis not present

## 2024-03-13 DIAGNOSIS — I700 Atherosclerosis of aorta: Secondary | ICD-10-CM | POA: Diagnosis not present

## 2024-03-13 DIAGNOSIS — W010XXA Fall on same level from slipping, tripping and stumbling without subsequent striking against object, initial encounter: Secondary | ICD-10-CM | POA: Diagnosis not present

## 2024-03-13 DIAGNOSIS — R079 Chest pain, unspecified: Secondary | ICD-10-CM | POA: Diagnosis not present

## 2024-03-13 DIAGNOSIS — J9811 Atelectasis: Secondary | ICD-10-CM | POA: Diagnosis not present

## 2024-03-13 MED ORDER — HYDROCODONE-ACETAMINOPHEN 5-325 MG PO TABS
1.0000 | ORAL_TABLET | Freq: Once | ORAL | Status: AC
  Administered 2024-03-13: 1 via ORAL
  Filled 2024-03-13: qty 1

## 2024-03-13 MED ORDER — IBUPROFEN 600 MG PO TABS
600.0000 mg | ORAL_TABLET | Freq: Once | ORAL | Status: AC
  Administered 2024-03-13: 600 mg via ORAL
  Filled 2024-03-13: qty 1

## 2024-03-13 NOTE — Discharge Instructions (Addendum)
 Your workup today showed that you do NOT have a fracture to one or more ribs.  This may be more of an internal bruise.  Unfortunately this type of injury hurts but there is no way to fix it immediately; it must heal over time.  Be sure to take plenty of deep breaths so that you get rid of the bad air in your lungs.  If you are given a device called an incentive spirometer, please use it as recommended.  Unless you have been told by your doctor not to do so, we recommend you take ibuprofen  600 mg 3 times daily with meals for no more than 5 days.  You can also take Tylenol  1000 mg every 6 hours for pain.  Follow-up with your PCP.  Return to the emergency department if he develop new or worsening symptoms that concern you.   Rib Fracture A rib fracture is a break or crack in one of the bones of the ribs. The ribs are a group of long, curved bones that wrap around your chest and attach to your spine. They protect your lungs and other organs in the chest cavity. A broken or cracked rib is often painful, but most do not cause other problems. Most rib fractures heal on their own over time. However, rib fractures can be more serious if multiple ribs are broken or if broken ribs move out of place and push against other structures. CAUSES  A direct blow to the chest. For example, this could happen during contact sports, a car accident, or a fall against a hard object. Repetitive movements with high force, such as pitching a baseball or having severe coughing spells. SYMPTOMS  Pain when you breathe in or cough. Pain when someone presses on the injured area. DIAGNOSIS  Your caregiver will perform a physical exam. Various imaging tests may be ordered to confirm the diagnosis and to look for related injuries. These tests may include a chest X-ray, computed tomography (CT), magnetic resonance imaging (MRI), or a bone scan. TREATMENT  Rib fractures usually heal on their own in 1-3 months. The longer healing  period is often associated with a continued cough or other aggravating activities. During the healing period, pain control is very important. Medication is usually given to control pain. Hospitalization or surgery may be needed for more severe injuries, such as those in which multiple ribs are broken or the ribs have moved out of place.  HOME CARE INSTRUCTIONS  Avoid strenuous activity and any activities or movements that cause pain. Be careful during activities and avoid bumping the injured rib. Gradually increase activity as directed by your caregiver. Only take over-the-counter or prescription medications as directed by your caregiver. Do not take other medications without asking your caregiver first. Apply ice to the injured area for the first 1-2 days after you have been treated or as directed by your caregiver. Applying ice helps to reduce inflammation and pain. Put ice in a plastic bag. Place a towel between your skin and the bag.   Leave the ice on for 15-20 minutes at a time, every 2 hours while you are awake. Perform deep breathing as directed by your caregiver. This will help prevent pneumonia, which is a common complication of a broken rib. Your caregiver may instruct you to: Take deep breaths several times a day. Try to cough several times a day, holding a pillow against the injured area. Use a device called an incentive spirometer to practice deep breathing several times  a day. Drink enough fluids to keep your urine clear or pale yellow. This will help you avoid constipation.   Do not wear a rib belt or binder. These restrict breathing, which can lead to pneumonia.   SEEK IMMEDIATE MEDICAL CARE IF:  You have a fever.   You have difficulty breathing or shortness of breath.   You develop a continual cough, or you cough up thick or bloody sputum. You feel sick to your stomach (nausea), throw up (vomit), or have abdominal pain.   You have worsening pain not controlled with medications.    MAKE SURE YOU: Understand these instructions. Will watch your condition. Will get help right away if you are not doing well or get worse. Document Released: 07/08/2005 Document Revised: 03/10/2013 Document Reviewed: 09/09/2012 [facility] Patient Information 2015 [place], [place]. This information is not intended to replace advice given to you by your health care provider. Make sure you discuss any questions you have with your health care provider.

## 2024-03-13 NOTE — ED Triage Notes (Addendum)
 Pt ambulatory to triage, states he fell on the grass when he tripped and fell, and when he landed, on his left side onto his left hand. When he takes a deep breath or touches his left lateral rib area he has pain. He does having any left arm pain. Took 2 ibuprofen  around 1630 today. Tender to touch, breath sounds clear.

## 2024-03-13 NOTE — ED Notes (Signed)
 Pt was discharged by PA, left ambulatory, no distress

## 2024-03-14 NOTE — ED Provider Notes (Incomplete)
 [facility] Provider Note    Event Date/Time   First MD Initiated Contact with Patient 03/13/24 2155     (approximate)   History   Rib Injury   HPI  Patrick Hood is a 83 y.o. male  with a past medical history of hypertension, GERD presents to the emergency department with left anterior rib pain after mechanical fall that occurred today.  Patient states he was cleaning out a wooden box outside and noticed wasps or hornets in the box and went to turn around and tripped      Physical Exam   Triage Vital Signs: ED Triage Vitals  Encounter Vitals Group     BP 03/13/24 1956 (!) 158/82     Girls Systolic BP Percentile --      Girls Diastolic BP Percentile --      Boys Systolic BP Percentile --      Boys Diastolic BP Percentile --      Pulse Rate 03/13/24 1956 82     Resp 03/13/24 1956 18     Temp 03/13/24 1956 (!) 97.5 F (36.4 C)     Temp Source 03/13/24 1956 Oral     SpO2 03/13/24 1956 95 %     Weight --      Height --      Head Circumference --      Peak Flow --      Pain Score 03/13/24 1955 4     Pain Loc --      Pain Education --      Exclude from Growth Chart --     Most recent vital signs: Vitals:   03/13/24 1956  BP: (!) 158/82  Pulse: 82  Resp: 18  Temp: (!) 97.5 F (36.4 C)  SpO2: 95%    General: Awake, in no acute distress. Appears stated age. Head: Normocephalic, atraumatic. Eyes: PERRLA. EOMs intact. No scleral icterus or conjunctival injection. Ears/Nose/Throat: TMs intact b/l. Nares patent, no nasal discharge. Oropharynx moist, no erythema or exudate. Dentition intact. Neck: Supple, no lymphadenopathy, no nuchal rigidity. CV: Good peripheral perfusion. No edema. *** Respiratory:Normal respiratory effort.  No respiratory distress. *** GI: Soft, non-distended, non-tender.  MSK: Normal ROM and  *** strength in *** extremities.  Skin:Warm, dry, intact. No rashes, lesions, or ecchymosis. No cyanosis or  pallor. Neurological: A&Ox4 to person, place, time, and situation. Cranial nerves II-XII intact.*** Sensation intact. Strength symmetric. No focal deficits.  Psychiatric: Mood and affect appropriate. Thought processes coherent.   ED Results / Procedures / Treatments   Labs (all labs ordered are listed, but only abnormal results are displayed) Labs Reviewed - No data to display   EKG  ***   RADIOLOGY ***   PROCEDURES:  Critical Care performed: {CriticalCareYesNo:19197::Yes, see critical care procedure note(s),No} Med Data Critical Care [email or web address] ***  Procedures   MEDICATIONS ORDERED IN ED: Medications  ibuprofen  (ADVIL ) tablet 600 mg (600 mg Oral Given 03/13/24 2311)  HYDROcodone -acetaminophen  (NORCO/VICODIN) 5-325 MG per tablet 1 tablet (1 tablet Oral Given 03/13/24 2311)     IMPRESSION / MDM / ASSESSMENT AND PLAN / ED COURSE  I reviewed the triage vital signs and the nursing notes.                              Differential diagnosis includes, but is not limited to, ***  Patient's presentation is most consistent with {EM COPA:27473}  *** I independently viewed  the x-ray*** and radiologist's report.  I agree with the radiologist's report ***.  {If the patient is on the monitor, remove the brackets and asterisks on the sentence below and remember to document it as a Procedure as well. Otherwise delete the sentence below:1} {**The patient is on the cardiac monitor to evaluate for evidence of arrhythmia and/or significant heart rate changes.**} {Remember to include, when applicable, any/all of the following data: independent review of imaging independent review of labs (comment specifically on pertinent positives and negatives) review of specific prior hospitalizations, PCP/specialist notes, etc. discuss meds given and prescribed document any discussion with consultants (including hospitalists) any clinical decision tools you used and why (PECARN, NEXUS,  etc.) did you consider admitting the patient? document social determinants of health affecting patient's care (homelessness, inability to follow up in a timely fashion, etc) document any pre-existing conditions increasing risk on current visit (e.g. diabetes and HTN increasing danger of high-risk chest pain/ACS) describes what meds you gave (especially parenteral) and why any other interventions?:1}     FINAL CLINICAL IMPRESSION(S) / ED DIAGNOSES   Final diagnoses:  Rib contusion, left, initial encounter     Rx / DC Orders   ED Discharge Orders     None        Note:  This document was prepared using Dragon voice recognition software and may include unintentional dictation errors.

## 2024-03-14 NOTE — ED Provider Notes (Signed)
 [facility] Provider Note    Event Date/Time   First MD Initiated Contact with Patient 03/13/24 2155     (approximate)   History   Rib Injury   HPI  Patrick Hood is a 83 y.o. male  with a past medical history of hypertension, GERD presents to the emergency department with left anterior rib pain after mechanical fall that occurred today.  Patient states he was cleaning out a wooden box outside and noticed wasps or hornets in the box and went to turn around and tripped in the grass and landed on his left side.  Patient reports increased pain when he takes a deep breath or coughs.  Patient has taken 2 ibuprofen  around 4:30 PM today without much relief.  Patient denies any difficulty breathing, chest pain, abdominal pain, vomiting, fever, chills, hematemesis, left arm or hand pain, numbness, or tingling. No cough, congestion.  States he was not stung by bees.      Physical Exam   Triage Vital Signs: ED Triage Vitals  Encounter Vitals Group     BP 03/13/24 1956 (!) 158/82     Girls Systolic BP Percentile --      Girls Diastolic BP Percentile --      Boys Systolic BP Percentile --      Boys Diastolic BP Percentile --      Pulse Rate 03/13/24 1956 82     Resp 03/13/24 1956 18     Temp 03/13/24 1956 (!) 97.5 F (36.4 C)     Temp Source 03/13/24 1956 Oral     SpO2 03/13/24 1956 95 %     Weight --      Height --      Head Circumference --      Peak Flow --      Pain Score 03/13/24 1955 4     Pain Loc --      Pain Education --      Exclude from Growth Chart --     Most recent vital signs: Vitals:   03/13/24 1956  BP: (!) 158/82  Pulse: 82  Resp: 18  Temp: (!) 97.5 F (36.4 C)  SpO2: 95%    General: Awake, in no acute distress. Appears stated age. CV: Good peripheral perfusion. No edema.  Respiratory:Normal respiratory effort.  No respiratory distress. Wheezing noted in left lower lung base. GI: Soft, non-distended, non-tender.  MSK: TTP  along anterior ribs just below the left breast, does not extend to lateral aspect.  Skin:Warm, dry, intact. No rashes, lesions, or ecchymosis. No cyanosis or pallor. Neurological: A&Ox4.  ED Results / Procedures / Treatments   Labs (all labs ordered are listed, but only abnormal results are displayed) Labs Reviewed - No data to display   EKG     RADIOLOGY CXR IMPRESSION: 1. Shallow inspiration with linear atelectasis in the left base. 2. Negative left ribs.  CT chest w/o contrast IMPRESSION: 1. Mild atelectasis in the lung bases. No evidence of active pulmonary disease. 2. No acute bony abnormalities. 3. Aortic atherosclerosis.   PROCEDURES:  Critical Care performed: No   Procedures   MEDICATIONS ORDERED IN ED: Medications  ibuprofen  (ADVIL ) tablet 600 mg (600 mg Oral Given 03/13/24 2311)  HYDROcodone -acetaminophen  (NORCO/VICODIN) 5-325 MG per tablet 1 tablet (1 tablet Oral Given 03/13/24 2311)     IMPRESSION / MDM / ASSESSMENT AND PLAN / ED COURSE  I reviewed the triage vital signs and the nursing notes.  Differential diagnosis includes, but is not limited to, rib contusion, rib fracture, pneumothorax, pneumonia, atelectasis  Patient's presentation is most consistent with acute complicated illness / injury requiring diagnostic workup.  Patient is a 83 year old male presenting after mechanical fall today with left anterior rib pain.  Chest x-ray ordered and showed linear atelectasis in the left base, without any acute rib fracture.  Followed up with CT of chest she has mild atelectasis in the lung bases without any active pulmonary diseases, ribs appear intact. I independently viewed the x-rays and radiologist's report.  I agree with the radiologist's report that there are no acute fractures.  Patient is well-appearing, no respiratory distress, SpO2 95%, no Hx of asthma or COPD.  Did provide him with ibuprofen  and 1 dose of Norco to help  with his pain.  Discussed taking ibuprofen  and Tylenol  at home as well as gave him an incentive spirometer with instructions on how to use it.  He has a follow-up appointment scheduled with his PCP on Tuesday.  The patient may return to the emergency department for any new, worsening, or concerning symptoms. Patient was given the opportunity to ask questions; all questions were answered. Emergency department return precautions were discussed with the patient.  Patient is in agreement to the treatment plan.  Patient is stable for discharge.     FINAL CLINICAL IMPRESSION(S) / ED DIAGNOSES   Final diagnoses:  Rib contusion, left, initial encounter     Rx / DC Orders   ED Discharge Orders     None        Note:  This document was prepared using Dragon voice recognition software and may include unintentional dictation errors.     Sheron Salm, PA-C 03/14/24 0008    Jacolyn Pae, MD 03/18/24 [phone]

## 2024-03-17 DIAGNOSIS — R208 Other disturbances of skin sensation: Secondary | ICD-10-CM | POA: Diagnosis not present

## 2024-03-17 DIAGNOSIS — D485 Neoplasm of uncertain behavior of skin: Secondary | ICD-10-CM | POA: Diagnosis not present

## 2024-03-17 DIAGNOSIS — Z1331 Encounter for screening for depression: Secondary | ICD-10-CM | POA: Diagnosis not present

## 2024-03-17 DIAGNOSIS — B079 Viral wart, unspecified: Secondary | ICD-10-CM | POA: Diagnosis not present

## 2024-03-17 DIAGNOSIS — Z0000 Encounter for general adult medical examination without abnormal findings: Secondary | ICD-10-CM | POA: Diagnosis not present

## 2024-03-17 DIAGNOSIS — G3184 Mild cognitive impairment, so stated: Secondary | ICD-10-CM | POA: Diagnosis not present

## 2024-03-17 DIAGNOSIS — E785 Hyperlipidemia, unspecified: Secondary | ICD-10-CM | POA: Diagnosis not present

## 2024-03-17 DIAGNOSIS — Z79899 Other long term (current) drug therapy: Secondary | ICD-10-CM | POA: Diagnosis not present
